# Patient Record
Sex: Female | Born: 1945 | Race: White | Hispanic: No | Marital: Married | Smoking: Never smoker
Health system: Southern US, Community
[De-identification: ages and names within clinical notes are randomized; demographics above are authoritative.]

## PROBLEM LIST (undated history)

## (undated) DIAGNOSIS — C4491 Basal cell carcinoma of skin, unspecified: Secondary | ICD-10-CM

## (undated) DIAGNOSIS — H9190 Unspecified hearing loss, unspecified ear: Secondary | ICD-10-CM

## (undated) DIAGNOSIS — J189 Pneumonia, unspecified organism: Secondary | ICD-10-CM

## (undated) DIAGNOSIS — F419 Anxiety disorder, unspecified: Secondary | ICD-10-CM

## (undated) DIAGNOSIS — F32A Depression, unspecified: Secondary | ICD-10-CM

## (undated) DIAGNOSIS — I509 Heart failure, unspecified: Secondary | ICD-10-CM

## (undated) DIAGNOSIS — F99 Mental disorder, not otherwise specified: Secondary | ICD-10-CM

## (undated) DIAGNOSIS — E7800 Pure hypercholesterolemia, unspecified: Secondary | ICD-10-CM

## (undated) DIAGNOSIS — T4145XA Adverse effect of unspecified anesthetic, initial encounter: Secondary | ICD-10-CM

## (undated) DIAGNOSIS — E039 Hypothyroidism, unspecified: Secondary | ICD-10-CM

## (undated) DIAGNOSIS — I10 Essential (primary) hypertension: Secondary | ICD-10-CM

## (undated) DIAGNOSIS — T8859XA Other complications of anesthesia, initial encounter: Secondary | ICD-10-CM

## (undated) DIAGNOSIS — M1990 Unspecified osteoarthritis, unspecified site: Secondary | ICD-10-CM

## (undated) DIAGNOSIS — C50919 Malignant neoplasm of unspecified site of unspecified female breast: Secondary | ICD-10-CM

## (undated) DIAGNOSIS — F329 Major depressive disorder, single episode, unspecified: Secondary | ICD-10-CM

## (undated) DIAGNOSIS — G250 Essential tremor: Secondary | ICD-10-CM

## (undated) DIAGNOSIS — IMO0001 Reserved for inherently not codable concepts without codable children: Secondary | ICD-10-CM

## (undated) HISTORY — PX: COLONOSCOPY W/ BIOPSIES AND POLYPECTOMY: SHX1376

## (undated) HISTORY — PX: JOINT REPLACEMENT: SHX530

## (undated) HISTORY — PX: VARICOSE VEIN SURGERY: SHX832

## (undated) HISTORY — PX: ABDOMINAL HYSTERECTOMY: SHX81

## (undated) HISTORY — PX: RECTAL PROLAPSE REPAIR: SHX759

---

## 2002-03-28 HISTORY — PX: CARDIAC CATHETERIZATION: SHX172

## 2009-03-28 HISTORY — PX: TOTAL HIP ARTHROPLASTY: SHX124

## 2009-10-02 ENCOUNTER — Inpatient Hospital Stay (HOSPITAL_COMMUNITY): Admission: RE | Admit: 2009-10-02 | Discharge: 2009-10-04 | Payer: Self-pay | Admitting: Orthopedic Surgery

## 2010-06-13 LAB — CBC
HCT: 25.8 % — ABNORMAL LOW (ref 36.0–46.0)
MCH: 32.3 pg (ref 26.0–34.0)
MCH: 32.4 pg (ref 26.0–34.0)
MCHC: 34.2 g/dL (ref 30.0–36.0)
MCHC: 34.5 g/dL (ref 30.0–36.0)
MCV: 93.4 fL (ref 78.0–100.0)
MCV: 94.1 fL (ref 78.0–100.0)
Platelets: 161 10*3/uL (ref 150–400)
Platelets: 210 10*3/uL (ref 150–400)
RDW: 12.8 % (ref 11.5–15.5)
RDW: 13.4 % (ref 11.5–15.5)
RDW: 13.8 % (ref 11.5–15.5)
WBC: 8.5 10*3/uL (ref 4.0–10.5)

## 2010-06-13 LAB — URINALYSIS, ROUTINE W REFLEX MICROSCOPIC
Bilirubin Urine: NEGATIVE
Glucose, UA: NEGATIVE mg/dL
Hgb urine dipstick: NEGATIVE
Specific Gravity, Urine: 1.004 — ABNORMAL LOW (ref 1.005–1.030)

## 2010-06-13 LAB — BASIC METABOLIC PANEL
BUN: 6 mg/dL (ref 6–23)
CO2: 27 mEq/L (ref 19–32)
Chloride: 106 mEq/L (ref 96–112)
GFR calc non Af Amer: >60 mL/min (ref >60)
Glucose, Bld: 103 mg/dL — ABNORMAL HIGH (ref 70–99)
Potassium: 3.7 mEq/L (ref 3.5–5.1)
Potassium: 4.5 mEq/L (ref 3.5–5.1)
Sodium: 135 mEq/L (ref 135–145)
Sodium: 139 mEq/L (ref 135–145)

## 2010-06-13 LAB — DIFFERENTIAL
Basophils Relative: 0 % (ref 0–1)
Eosinophils Relative: 1 % (ref 0–5)
Lymphocytes Relative: 31 % (ref 12–46)
Lymphs Abs: 2.1 10*3/uL (ref 0.7–4.0)
Neutro Abs: 4.1 10*3/uL (ref 1.7–7.7)
Neutrophils Relative %: 61 % (ref 43–77)

## 2010-06-13 LAB — ABO/RH: ABO/RH(D): O POS

## 2010-06-13 LAB — TYPE AND SCREEN: Antibody Screen: NEGATIVE

## 2010-06-13 LAB — PROTIME-INR
INR: 0.97 (ref 0.00–1.49)
INR: 1.2 (ref 0.00–1.49)
INR: 1.29 (ref 0.00–1.49)
Prothrombin Time: 15.1 seconds (ref 11.6–15.2)

## 2010-06-13 LAB — APTT: aPTT: 28 seconds (ref 24–37)

## 2010-06-13 LAB — SURGICAL PCR SCREEN: Staphylococcus aureus: NEGATIVE

## 2011-03-29 HISTORY — PX: MASTECTOMY: SHX3

## 2012-04-24 ENCOUNTER — Ambulatory Visit: Payer: Self-pay | Admitting: Lab

## 2013-05-07 ENCOUNTER — Other Ambulatory Visit: Payer: Self-pay | Admitting: Orthopedic Surgery

## 2013-05-14 ENCOUNTER — Inpatient Hospital Stay (HOSPITAL_COMMUNITY): Admission: RE | Admit: 2013-05-14 | Payer: Self-pay | Source: Ambulatory Visit

## 2013-05-14 ENCOUNTER — Other Ambulatory Visit (HOSPITAL_COMMUNITY): Payer: Self-pay

## 2013-05-14 ENCOUNTER — Encounter (HOSPITAL_COMMUNITY): Payer: Self-pay | Admitting: Pharmacy Technician

## 2013-05-15 ENCOUNTER — Other Ambulatory Visit (HOSPITAL_COMMUNITY): Payer: Medicare HMO

## 2013-05-16 ENCOUNTER — Encounter (HOSPITAL_COMMUNITY)
Admission: RE | Admit: 2013-05-16 | Discharge: 2013-05-16 | Disposition: A | Discharge status: Home / Self Care | Payer: Medicare HMO | Source: Ambulatory Visit | Attending: Orthopedic Surgery | Admitting: Orthopedic Surgery

## 2013-05-16 ENCOUNTER — Other Ambulatory Visit (HOSPITAL_COMMUNITY): Payer: Medicare HMO

## 2013-05-16 ENCOUNTER — Encounter (HOSPITAL_COMMUNITY): Payer: Self-pay

## 2013-05-16 DIAGNOSIS — Z01818 Encounter for other preprocedural examination: Secondary | ICD-10-CM | POA: Insufficient documentation

## 2013-05-16 DIAGNOSIS — Z01812 Encounter for preprocedural laboratory examination: Secondary | ICD-10-CM | POA: Insufficient documentation

## 2013-05-16 DIAGNOSIS — Z01810 Encounter for preprocedural cardiovascular examination: Secondary | ICD-10-CM | POA: Insufficient documentation

## 2013-05-16 HISTORY — DX: Hypothyroidism, unspecified: E03.9

## 2013-05-16 HISTORY — DX: Other complications of anesthesia, initial encounter: T88.59XA

## 2013-05-16 HISTORY — DX: Essential tremor: G25.0

## 2013-05-16 HISTORY — DX: Unspecified osteoarthritis, unspecified site: M19.90

## 2013-05-16 HISTORY — DX: Heart failure, unspecified: I50.9

## 2013-05-16 HISTORY — DX: Mental disorder, not otherwise specified: F99

## 2013-05-16 HISTORY — DX: Adverse effect of unspecified anesthetic, initial encounter: T41.45XA

## 2013-05-16 HISTORY — DX: Pure hypercholesterolemia, unspecified: E78.00

## 2013-05-16 HISTORY — DX: Anxiety disorder, unspecified: F41.9

## 2013-05-16 HISTORY — DX: Pneumonia, unspecified organism: J18.9

## 2013-05-16 HISTORY — DX: Unspecified hearing loss, unspecified ear: H91.90

## 2013-05-16 HISTORY — DX: Depression, unspecified: F32.A

## 2013-05-16 HISTORY — DX: Reserved for inherently not codable concepts without codable children: IMO0001

## 2013-05-16 HISTORY — DX: Major depressive disorder, single episode, unspecified: F32.9

## 2013-05-16 LAB — BASIC METABOLIC PANEL
BUN: 16 mg/dL (ref 6–23)
CHLORIDE: 104 meq/L (ref 96–112)
CO2: 26 mEq/L (ref 19–32)
Calcium: 9.9 mg/dL (ref 8.4–10.5)
Creatinine, Ser: 0.77 mg/dL (ref 0.50–1.10)
GFR calc Af Amer: >90 mL/min (ref >90)
GFR calc non Af Amer: 85 mL/min — ABNORMAL LOW (ref >90)
GLUCOSE: 103 mg/dL — AB (ref 70–99)
POTASSIUM: 4.9 meq/L (ref 3.7–5.3)
Sodium: 143 mEq/L (ref 137–147)

## 2013-05-16 LAB — CBC WITH DIFFERENTIAL/PLATELET
BASOS PCT: 0 % (ref 0–1)
Basophils Absolute: 0 10*3/uL (ref 0.0–0.1)
EOS ABS: 0.1 10*3/uL (ref 0.0–0.7)
Eosinophils Relative: 1 % (ref 0–5)
HCT: 43.6 % (ref 36.0–46.0)
HEMOGLOBIN: 15.1 g/dL — AB (ref 12.0–15.0)
LYMPHS ABS: 2.3 10*3/uL (ref 0.7–4.0)
Lymphocytes Relative: 29 % (ref 12–46)
MCH: 32.1 pg (ref 26.0–34.0)
MCHC: 34.6 g/dL (ref 30.0–36.0)
MCV: 92.8 fL (ref 78.0–100.0)
MONO ABS: 0.4 10*3/uL (ref 0.1–1.0)
MONOS PCT: 5 % (ref 3–12)
Neutro Abs: 5.2 10*3/uL (ref 1.7–7.7)
Neutrophils Relative %: 65 % (ref 43–77)
Platelets: 241 10*3/uL (ref 150–400)
RBC: 4.7 MIL/uL (ref 3.87–5.11)
RDW: 13.4 % (ref 11.5–15.5)
WBC: 8 10*3/uL (ref 4.0–10.5)

## 2013-05-16 LAB — URINALYSIS, ROUTINE W REFLEX MICROSCOPIC
Bilirubin Urine: NEGATIVE
GLUCOSE, UA: NEGATIVE mg/dL
Hgb urine dipstick: NEGATIVE
Ketones, ur: NEGATIVE mg/dL
LEUKOCYTES UA: NEGATIVE
NITRITE: NEGATIVE
PROTEIN: NEGATIVE mg/dL
Specific Gravity, Urine: 1.007 (ref 1.005–1.030)
Urobilinogen, UA: 0.2 mg/dL (ref 0.0–1.0)
pH: 7 (ref 5.0–8.0)

## 2013-05-16 LAB — TYPE AND SCREEN
ABO/RH(D): O POS
Antibody Screen: NEGATIVE

## 2013-05-16 LAB — PROTIME-INR
INR: 0.89 (ref 0.00–1.49)
Prothrombin Time: 11.9 seconds (ref 11.6–15.2)

## 2013-05-16 LAB — APTT: aPTT: 26 seconds (ref 24–37)

## 2013-05-16 LAB — SURGICAL PCR SCREEN
MRSA, PCR: NEGATIVE
Staphylococcus aureus: NEGATIVE

## 2013-05-16 NOTE — Progress Notes (Addendum)
Pt denies SOB, chest pain, and being under the care of a cardiologist. Pt stated that she had a stress test and cardiac cath in 2004 and her last echo in " 2005 or 2006 " at [facility]; pt denies being under the care of a cardiologist since 2005 or 2006 when " the echo was okay." Pt denies having an EKG and chest x ray within the last year.

## 2013-05-16 NOTE — Pre-Procedure Instructions (Signed)
[name]  05/16/2013   Your procedure is scheduled on:  Monday, May 20, 2013 at 9:15 AM  Report to [facility] (use Main Entrance "A'') at 7:15 AM.  Call this number if you have problems the morning of surgery: [phone]   Remember:   Do not eat food or drink liquids after midnight.   Take these medicines the morning of surgery with A SIP OF WATER: carvedilol (COREG) 25 MG tablet, DULoxetine (CYMBALTA) 30 MG capsule, levothyroxine (SYNTHROID, LEVOTHROID) 50 MCG  Stop taking Aspirin and herbal medications. Do not take any NSAIDs ie: Ibuprofen, Advil, Naproxen or any medication containing Aspirin ( meloxicam (MOBIC) 15 MG tablet)  Do not wear jewelry, make-up otr nail polish.  Do not wear lotions, powders, or perfumes. You may wear deodorant.  Do not shave 48 hours prior to surgery.   Do not bring valuables to the hospital.  [facility] is not responsible for any belongings or valuables.               Contacts, dentures or bridgework may not be worn into surgery.  Leave suitcase in the car. After surgery it may be brought to your room.  For patients admitted to the hospital, discharge time is determined by your treatment team.               Patients discharged the day of surgery will not be allowed to drive home.  Name and phone number of your driver:  Special Instructions:  Special Instructions:Special Instructions: [facility] - Preparing for Surgery  Before surgery, you can play an important role.  Because skin is not sterile, your skin needs to be as free of germs as possible.  You can reduce the number of germs on you skin by washing with CHG (chlorahexidine gluconate) soap before surgery.  CHG is an antiseptic cleaner which kills germs and bonds with the skin to continue killing germs even after washing.  Please DO NOT use if you have an allergy to CHG or antibacterial soaps.  If your skin becomes reddened/irritated stop using the CHG and inform your nurse  when you arrive at Short Stay.  Do not shave (including legs and underarms) for at least 48 hours prior to the first CHG shower.  You may shave your face.  Please follow these instructions carefully:   1.  Shower with CHG Soap the night before surgery and the morning of Surgery.  2.  If you choose to wash your hair, wash your hair first as usual with your normal shampoo.  3.  After you shampoo, rinse your hair and body thoroughly to remove the Shampoo.  4.  Use CHG as you would any other liquid soap.  You can apply chg directly  to the skin and wash gently with scrungie or a clean washcloth.  5.  Apply the CHG Soap to your body ONLY FROM THE NECK DOWN.  Do not use on open wounds or open sores.  Avoid contact with your eyes, ears, mouth and genitals (private parts).  Wash genitals (private parts) with your normal soap.  6.  Wash thoroughly, paying special attention to the area where your surgery will be performed.  7.  Thoroughly rinse your body with warm water from the neck down.  8.  DO NOT shower/wash with your normal soap after using and rinsing off the CHG Soap.  9.  Pat yourself dry with a clean towel.  10.  Wear clean pajamas.            11.  Place clean sheets on your bed the night of your first shower and do not sleep with pets.  Day of Surgery  Do not apply any lotions the morning of surgery.  Please wear clean clothes to the hospital/surgery center.   Please read over the following fact sheets that you were given: Pain Booklet, Coughing and Deep Breathing, Blood Transfusion Information, Total Joint Packet, MRSA Information and Surgical Site Infection Prevention

## 2013-05-17 NOTE — H&P (Signed)
TOTAL HIP ADMISSION H&P  Patient is admitted for left total hip arthroplasty.  Subjective:  Chief Complaint: left hip pain  HPI: Laura Zavala, 68 y.o. female, has a history of pain and functional disability in the left hip(s) due to arthritis and patient has failed non-surgical conservative treatments for greater than 12 weeks to include NSAID's and/or analgesics, corticosteriod injections, supervised PT with diminished ADL's post treatment and activity modification.  Onset of symptoms was gradual starting several years ago with gradually worsening course since that time.The patient noted no past surgery on the left hip(s).  Patient currently rates pain in the left hip at 10 out of 10 with activity. Patient has worsening of pain with activity and weight bearing, pain that interfers with activities of daily living and pain with passive range of motion. Patient has evidence of joint space narrowing by imaging studies. This condition presents safety issues increasing the risk of falls.  There is no current active infection.  There are no active problems to display for this patient.  Past Medical History  Diagnosis Date  . Complication of anesthesia     Hx: of SOB : when I was under the age of 5."  . OA (osteoarthritis)     Hx: of   . Hearing impaired     Hx: of B/L hearing aids  . CHF (congestive heart failure)   . Hypercholesterolemia     Hx: of  . Cancer     Basal cell carcinoma of the nose; breast cancer  . Pneumonia   . Hypothyroidism   . Mental disorder   . Anxiety   . Depression   . Tremor, hereditary, benign     Hx: of head    Past Surgical History  Procedure Laterality Date  . Mastectomy      Hx: of B/L  . Abdominal hysterectomy    . Varicose vein surgery      Hx; of  . Cardiac catheterization      Hx: of 2004  . Colonoscopy w/ biopsies and polypectomy      Hx: of  . Joint replacement      hx: of Right hip  . Rectal prolapse repair      Hx: of    No  prescriptions prior to admission   Allergies  Allergen Reactions  . Keflex [Cephalexin] Nausea And Vomiting and Other (See Comments)    headache  . Oxycodone Rash    History  Substance Use Topics  . Smoking status: Never Smoker   . Smokeless tobacco: Never Used  . Alcohol Use: Yes     Comment: 2 glasses of wine with dinner    Family History  Problem Relation Age of Onset  . Cancer Other      Review of Systems  Constitutional: Negative.   HENT: Negative.   Eyes: Negative.   Respiratory: Negative.   Cardiovascular: Negative.   Gastrointestinal: Negative.   Genitourinary: Negative.   Musculoskeletal: Positive for joint pain.       Left hip pain  Skin: Negative.   Neurological: Negative.   Psychiatric/Behavioral: Negative.     Objective:  Physical Exam  Constitutional: She is oriented to person, place, and time. She appears well-developed and well-nourished.  HENT:  Head: Normocephalic and atraumatic.  Eyes: Pupils are equal, round, and reactive to light.  Neck: Normal range of motion. Neck supple.  Cardiovascular: Intact distal pulses.   Respiratory: Effort normal.  Musculoskeletal:  Patient's left hip does have reduced range  with internal and external rotation.  She does have moderate pain with internal rotation.  No pain with palpation of the buttock or lateral hip.  She does have groin pain.  She has good strength bilaterally.  She has brisk capillary refill and is neurovascularly intact distally.  Neurological: She is alert and oriented to person, place, and time.  Skin: Skin is warm and dry.  Psychiatric: She has a normal mood and affect. Her behavior is normal. Judgment and thought content normal.    Vital signs in last 24 hours:    Labs:   There is no height or weight on file to calculate BMI.   Imaging Review Radiographs:  X-rays were ordered, performed, and interpreted by myself and Dr. Mayer Camel today included; AP of the pelvis and lateral views of  bilateral hips are taken and reviewed in office today.  No fracture dislocation subluxation or tumors identified.  Patient does have moderate left hip arthritis.  Assessment/Plan:  End stage arthritis, left hip(s)  The patient history, physical examination, clinical judgement of the provider and imaging studies are consistent with end stage degenerative joint disease of the left hip(s) and total hip arthroplasty is deemed medically necessary. The treatment options including medical management, injection therapy, arthroscopy and arthroplasty were discussed at length. The risks and benefits of total hip arthroplasty were presented and reviewed. The risks due to aseptic loosening, infection, stiffness, dislocation/subluxation,  thromboembolic complications and other imponderables were discussed.  The patient acknowledged the explanation, agreed to proceed with the plan and consent was signed. Patient is being admitted for inpatient treatment for surgery, pain control, PT, OT, prophylactic antibiotics, VTE prophylaxis, progressive ambulation and ADL's and discharge planning.The patient is planning to be discharged to skilled nursing facility.

## 2013-05-19 MED ORDER — CEFAZOLIN SODIUM-DEXTROSE 2-3 GM-% IV SOLR
2.0000 g | INTRAVENOUS | Status: AC
Start: 2013-05-20 — End: 2013-05-20
  Administered 2013-05-20: 2 g via INTRAVENOUS
  Filled 2013-05-19: qty 50

## 2013-05-20 ENCOUNTER — Inpatient Hospital Stay (HOSPITAL_COMMUNITY)
Admission: RE | Admit: 2013-05-20 | Discharge: 2013-05-22 | DRG: 470 | Disposition: A | Discharge status: Home Health | Payer: Medicare HMO | Source: Ambulatory Visit | Attending: Orthopedic Surgery | Admitting: Orthopedic Surgery

## 2013-05-20 ENCOUNTER — Encounter (HOSPITAL_COMMUNITY): Payer: Self-pay | Admitting: *Deleted

## 2013-05-20 ENCOUNTER — Encounter (HOSPITAL_COMMUNITY): Payer: Medicare HMO | Admitting: Critical Care Medicine

## 2013-05-20 ENCOUNTER — Inpatient Hospital Stay (HOSPITAL_COMMUNITY): Payer: Medicare HMO | Admitting: Critical Care Medicine

## 2013-05-20 ENCOUNTER — Inpatient Hospital Stay (HOSPITAL_COMMUNITY): Payer: Medicare HMO

## 2013-05-20 ENCOUNTER — Encounter (HOSPITAL_COMMUNITY)
Admission: RE | Disposition: A | Discharge status: Home Health | Payer: Self-pay | Source: Ambulatory Visit | Attending: Orthopedic Surgery

## 2013-05-20 DIAGNOSIS — I509 Heart failure, unspecified: Secondary | ICD-10-CM | POA: Diagnosis present

## 2013-05-20 DIAGNOSIS — I10 Essential (primary) hypertension: Secondary | ICD-10-CM | POA: Diagnosis present

## 2013-05-20 DIAGNOSIS — M1610 Unilateral primary osteoarthritis, unspecified hip: Principal | ICD-10-CM | POA: Diagnosis present

## 2013-05-20 DIAGNOSIS — E039 Hypothyroidism, unspecified: Secondary | ICD-10-CM | POA: Diagnosis present

## 2013-05-20 DIAGNOSIS — M1612 Unilateral primary osteoarthritis, left hip: Secondary | ICD-10-CM | POA: Diagnosis present

## 2013-05-20 DIAGNOSIS — E7800 Pure hypercholesterolemia, unspecified: Secondary | ICD-10-CM | POA: Diagnosis present

## 2013-05-20 DIAGNOSIS — M169 Osteoarthritis of hip, unspecified: Principal | ICD-10-CM | POA: Diagnosis present

## 2013-05-20 HISTORY — DX: Unspecified osteoarthritis, unspecified site: M19.90

## 2013-05-20 HISTORY — PX: TOTAL HIP ARTHROPLASTY: SHX124

## 2013-05-20 HISTORY — DX: Basal cell carcinoma of skin, unspecified: C44.91

## 2013-05-20 HISTORY — DX: Essential (primary) hypertension: I10

## 2013-05-20 HISTORY — DX: Malignant neoplasm of unspecified site of unspecified female breast: C50.919

## 2013-05-20 SURGERY — ARTHROPLASTY, HIP, TOTAL,POSTERIOR APPROACH
Anesthesia: General | Site: Hip | Laterality: Left

## 2013-05-20 MED ORDER — ATORVASTATIN CALCIUM 10 MG PO TABS
10.0000 mg | ORAL_TABLET | Freq: Every day | ORAL | Status: DC
  Administered 2013-05-20 – 2013-05-21 (×2): 10 mg via ORAL
  Filled 2013-05-20 (×3): qty 1

## 2013-05-20 MED ORDER — LIDOCAINE HCL (CARDIAC) 20 MG/ML IV SOLN
INTRAVENOUS | Status: AC
  Filled 2013-05-20: qty 5

## 2013-05-20 MED ORDER — LIDOCAINE HCL (CARDIAC) 20 MG/ML IV SOLN
INTRAVENOUS | Status: DC | PRN
  Administered 2013-05-20: 100 mg via INTRAVENOUS

## 2013-05-20 MED ORDER — ASPIRIN EC 325 MG PO TBEC
325.0000 mg | DELAYED_RELEASE_TABLET | Freq: Every day | ORAL | Status: DC
  Administered 2013-05-21 – 2013-05-22 (×2): 325 mg via ORAL
  Filled 2013-05-20 (×3): qty 1

## 2013-05-20 MED ORDER — PROPOFOL 10 MG/ML IV BOLUS
INTRAVENOUS | Status: AC
  Filled 2013-05-20: qty 20

## 2013-05-20 MED ORDER — HYDROCODONE-ACETAMINOPHEN 7.5-325 MG/15ML PO SOLN
ORAL | Status: AC
  Filled 2013-05-20: qty 15

## 2013-05-20 MED ORDER — OXYCODONE HCL 5 MG PO TABS: 5.0000 mg | ORAL_TABLET | Freq: Once | ORAL | Status: DC | PRN

## 2013-05-20 MED ORDER — MAGNESIUM CITRATE PO SOLN: 1.0000 | Freq: Once | ORAL | Status: AC | PRN

## 2013-05-20 MED ORDER — GLYCOPYRROLATE 0.2 MG/ML IJ SOLN
INTRAMUSCULAR | Status: AC
  Filled 2013-05-20: qty 2

## 2013-05-20 MED ORDER — DULOXETINE HCL 30 MG PO CPEP
30.0000 mg | ORAL_CAPSULE | Freq: Every day | ORAL | Status: DC
  Administered 2013-05-21 – 2013-05-22 (×2): 30 mg via ORAL
  Filled 2013-05-20 (×2): qty 1

## 2013-05-20 MED ORDER — KCL IN DEXTROSE-NACL 20-5-0.45 MEQ/L-%-% IV SOLN
INTRAVENOUS | Status: DC
  Administered 2013-05-20: 125 mL via INTRAVENOUS
  Filled 2013-05-20 (×8): qty 1000

## 2013-05-20 MED ORDER — HYDROMORPHONE HCL PF 1 MG/ML IJ SOLN
INTRAMUSCULAR | Status: AC
  Filled 2013-05-20: qty 1

## 2013-05-20 MED ORDER — ROCURONIUM BROMIDE 100 MG/10ML IV SOLN
INTRAVENOUS | Status: DC | PRN
  Administered 2013-05-20: 50 mg via INTRAVENOUS

## 2013-05-20 MED ORDER — BUPIVACAINE-EPINEPHRINE (PF) 0.5% -1:200000 IJ SOLN
INTRAMUSCULAR | Status: AC
  Filled 2013-05-20: qty 10

## 2013-05-20 MED ORDER — BISACODYL 5 MG PO TBEC: 5.0000 mg | DELAYED_RELEASE_TABLET | Freq: Every day | ORAL | Status: DC | PRN

## 2013-05-20 MED ORDER — ONDANSETRON HCL 4 MG PO TABS: 4.0000 mg | ORAL_TABLET | Freq: Four times a day (QID) | ORAL | Status: DC | PRN

## 2013-05-20 MED ORDER — DEXAMETHASONE SODIUM PHOSPHATE 4 MG/ML IJ SOLN
INTRAMUSCULAR | Status: DC | PRN
  Administered 2013-05-20: 4 mg via INTRAVENOUS

## 2013-05-20 MED ORDER — ONDANSETRON HCL 4 MG/2ML IJ SOLN: 4.0000 mg | Freq: Four times a day (QID) | INTRAMUSCULAR | Status: DC | PRN

## 2013-05-20 MED ORDER — DEXTROSE-NACL 5-0.45 % IV SOLN: INTRAVENOUS | Status: DC

## 2013-05-20 MED ORDER — GLYCOPYRROLATE 0.2 MG/ML IJ SOLN
INTRAMUSCULAR | Status: DC | PRN
  Administered 2013-05-20: 0.6 mg via INTRAVENOUS

## 2013-05-20 MED ORDER — NEOSTIGMINE METHYLSULFATE 1 MG/ML IJ SOLN
INTRAMUSCULAR | Status: DC | PRN
  Administered 2013-05-20: 4 mg via INTRAVENOUS

## 2013-05-20 MED ORDER — HYDROCODONE-ACETAMINOPHEN 7.5-325 MG PO TABS
1.0000 | ORAL_TABLET | ORAL | Status: DC | PRN
  Administered 2013-05-20: 1 via ORAL
  Administered 2013-05-21 – 2013-05-22 (×3): 2 via ORAL
  Filled 2013-05-20 (×4): qty 2

## 2013-05-20 MED ORDER — MENTHOL 3 MG MT LOZG: 1.0000 | LOZENGE | OROMUCOSAL | Status: DC | PRN

## 2013-05-20 MED ORDER — METHOCARBAMOL 500 MG PO TABS
500.0000 mg | ORAL_TABLET | Freq: Four times a day (QID) | ORAL | Status: DC | PRN
  Administered 2013-05-20 – 2013-05-22 (×4): 500 mg via ORAL
  Filled 2013-05-20 (×4): qty 1

## 2013-05-20 MED ORDER — METHOCARBAMOL 100 MG/ML IJ SOLN
500.0000 mg | Freq: Four times a day (QID) | INTRAVENOUS | Status: DC | PRN
  Filled 2013-05-20: qty 5

## 2013-05-20 MED ORDER — NEOSTIGMINE METHYLSULFATE 1 MG/ML IJ SOLN
INTRAMUSCULAR | Status: AC
  Filled 2013-05-20: qty 10

## 2013-05-20 MED ORDER — HYDROMORPHONE HCL PF 1 MG/ML IJ SOLN
0.2500 mg | INTRAMUSCULAR | Status: DC | PRN
  Administered 2013-05-20 (×6): 0.5 mg via INTRAVENOUS

## 2013-05-20 MED ORDER — CHLORHEXIDINE GLUCONATE 4 % EX LIQD: 1.0000 "application " | Freq: Once | CUTANEOUS | Status: DC

## 2013-05-20 MED ORDER — TRANEXAMIC ACID 100 MG/ML IV SOLN
1000.0000 mg | INTRAVENOUS | Status: AC
  Administered 2013-05-20: 1000 mg via INTRAVENOUS
  Filled 2013-05-20: qty 10

## 2013-05-20 MED ORDER — MEPERIDINE HCL 25 MG/ML IJ SOLN: 6.2500 mg | INTRAMUSCULAR | Status: DC | PRN

## 2013-05-20 MED ORDER — SODIUM CHLORIDE 0.9 % IJ SOLN
INTRAMUSCULAR | Status: AC
  Filled 2013-05-20: qty 10

## 2013-05-20 MED ORDER — EPHEDRINE SULFATE 50 MG/ML IJ SOLN
INTRAMUSCULAR | Status: DC | PRN
  Administered 2013-05-20: 10 mg via INTRAVENOUS
  Administered 2013-05-20: 5 mg via INTRAVENOUS
  Administered 2013-05-20: 10 mg via INTRAVENOUS

## 2013-05-20 MED ORDER — METOCLOPRAMIDE HCL 5 MG/ML IJ SOLN: 5.0000 mg | Freq: Three times a day (TID) | INTRAMUSCULAR | Status: DC | PRN

## 2013-05-20 MED ORDER — CHLORHEXIDINE GLUCONATE 4 % EX LIQD: 60.0000 mL | Freq: Once | CUTANEOUS | Status: DC

## 2013-05-20 MED ORDER — ONDANSETRON HCL 4 MG/2ML IJ SOLN: 4.0000 mg | Freq: Once | INTRAMUSCULAR | Status: DC | PRN

## 2013-05-20 MED ORDER — ACETAMINOPHEN 325 MG PO TABS: 650.0000 mg | ORAL_TABLET | Freq: Four times a day (QID) | ORAL | Status: DC | PRN

## 2013-05-20 MED ORDER — MIDAZOLAM HCL 2 MG/2ML IJ SOLN
INTRAMUSCULAR | Status: AC
  Filled 2013-05-20: qty 2

## 2013-05-20 MED ORDER — METOCLOPRAMIDE HCL 10 MG PO TABS: 5.0000 mg | ORAL_TABLET | Freq: Three times a day (TID) | ORAL | Status: DC | PRN

## 2013-05-20 MED ORDER — ONDANSETRON HCL 4 MG/2ML IJ SOLN
INTRAMUSCULAR | Status: DC | PRN
  Administered 2013-05-20: 4 mg via INTRAVENOUS

## 2013-05-20 MED ORDER — PROPOFOL 10 MG/ML IV BOLUS
INTRAVENOUS | Status: DC | PRN
  Administered 2013-05-20: 100 mg via INTRAVENOUS

## 2013-05-20 MED ORDER — CARVEDILOL 25 MG PO TABS
25.0000 mg | ORAL_TABLET | Freq: Two times a day (BID) | ORAL | Status: DC
  Administered 2013-05-21 – 2013-05-22 (×3): 25 mg via ORAL
  Filled 2013-05-20 (×5): qty 1

## 2013-05-20 MED ORDER — SODIUM CHLORIDE 0.9 % IR SOLN
Status: DC | PRN
  Administered 2013-05-20: 1000 mL

## 2013-05-20 MED ORDER — ROCURONIUM BROMIDE 50 MG/5ML IV SOLN
INTRAVENOUS | Status: AC
  Filled 2013-05-20: qty 1

## 2013-05-20 MED ORDER — FENTANYL CITRATE 0.05 MG/ML IJ SOLN
INTRAMUSCULAR | Status: DC | PRN
  Administered 2013-05-20: 50 ug via INTRAVENOUS
  Administered 2013-05-20: 25 ug via INTRAVENOUS
  Administered 2013-05-20: 100 ug via INTRAVENOUS
  Administered 2013-05-20: 25 ug via INTRAVENOUS
  Administered 2013-05-20: 50 ug via INTRAVENOUS

## 2013-05-20 MED ORDER — EPHEDRINE SULFATE 50 MG/ML IJ SOLN
INTRAMUSCULAR | Status: AC
  Filled 2013-05-20: qty 1

## 2013-05-20 MED ORDER — ONDANSETRON HCL 4 MG/2ML IJ SOLN
INTRAMUSCULAR | Status: AC
  Filled 2013-05-20: qty 2

## 2013-05-20 MED ORDER — FENTANYL CITRATE 0.05 MG/ML IJ SOLN
INTRAMUSCULAR | Status: AC
  Filled 2013-05-20: qty 5

## 2013-05-20 MED ORDER — PHENYLEPHRINE HCL 10 MG/ML IJ SOLN
INTRAMUSCULAR | Status: DC | PRN
  Administered 2013-05-20 (×3): 80 ug via INTRAVENOUS

## 2013-05-20 MED ORDER — ARTIFICIAL TEARS OP OINT
TOPICAL_OINTMENT | OPHTHALMIC | Status: AC
  Filled 2013-05-20: qty 3.5

## 2013-05-20 MED ORDER — HYDROMORPHONE HCL PF 1 MG/ML IJ SOLN
1.0000 mg | INTRAMUSCULAR | Status: DC | PRN
Start: 2013-05-20 — End: 2013-05-22
  Administered 2013-05-20 – 2013-05-21 (×8): 1 mg via INTRAVENOUS
  Filled 2013-05-20 (×6): qty 1

## 2013-05-20 MED ORDER — METHOCARBAMOL 500 MG PO TABS
ORAL_TABLET | ORAL | Status: AC
  Filled 2013-05-20: qty 1

## 2013-05-20 MED ORDER — KCL IN DEXTROSE-NACL 20-5-0.45 MEQ/L-%-% IV SOLN
INTRAVENOUS | Status: AC
  Filled 2013-05-20: qty 1000

## 2013-05-20 MED ORDER — DIPHENHYDRAMINE HCL 12.5 MG/5ML PO ELIX
12.5000 mg | ORAL_SOLUTION | ORAL | Status: DC | PRN
Start: 2013-05-20 — End: 2013-05-22
  Administered 2013-05-20 – 2013-05-21 (×2): 25 mg via ORAL
  Filled 2013-05-20 (×2): qty 10

## 2013-05-20 MED ORDER — OXYCODONE HCL 5 MG/5ML PO SOLN: 5.0000 mg | Freq: Once | ORAL | Status: DC | PRN

## 2013-05-20 MED ORDER — PHENOL 1.4 % MT LIQD: 1.0000 | OROMUCOSAL | Status: DC | PRN

## 2013-05-20 MED ORDER — PHENYLEPHRINE 40 MCG/ML (10ML) SYRINGE FOR IV PUSH (FOR BLOOD PRESSURE SUPPORT)
PREFILLED_SYRINGE | INTRAVENOUS | Status: AC
  Filled 2013-05-20: qty 10

## 2013-05-20 MED ORDER — GLYCOPYRROLATE 0.2 MG/ML IJ SOLN
INTRAMUSCULAR | Status: AC
  Filled 2013-05-20: qty 1

## 2013-05-20 MED ORDER — LACTATED RINGERS IV SOLN
INTRAVENOUS | Status: DC
  Administered 2013-05-20 (×2): via INTRAVENOUS

## 2013-05-20 MED ORDER — BUPIVACAINE-EPINEPHRINE 0.5% -1:200000 IJ SOLN
INTRAMUSCULAR | Status: DC | PRN
  Administered 2013-05-20: 20 mL

## 2013-05-20 MED ORDER — MIDAZOLAM HCL 5 MG/5ML IJ SOLN
INTRAMUSCULAR | Status: DC | PRN
  Administered 2013-05-20: 2 mg via INTRAVENOUS

## 2013-05-20 MED ORDER — ACETAMINOPHEN 650 MG RE SUPP: 650.0000 mg | Freq: Four times a day (QID) | RECTAL | Status: DC | PRN

## 2013-05-20 MED ORDER — LEVOTHYROXINE SODIUM 50 MCG PO TABS
50.0000 ug | ORAL_TABLET | Freq: Every day | ORAL | Status: DC
  Administered 2013-05-21 – 2013-05-22 (×3): 50 ug via ORAL
  Filled 2013-05-20 (×3): qty 1

## 2013-05-20 MED ORDER — DOCUSATE SODIUM 100 MG PO CAPS
100.0000 mg | ORAL_CAPSULE | Freq: Two times a day (BID) | ORAL | Status: DC
  Administered 2013-05-20 – 2013-05-22 (×4): 100 mg via ORAL
  Filled 2013-05-20 (×7): qty 1

## 2013-05-20 MED ORDER — HYDROMORPHONE HCL PF 1 MG/ML IJ SOLN
INTRAMUSCULAR | Status: AC
  Administered 2013-05-20: 0.5 mg via INTRAVENOUS
  Filled 2013-05-20: qty 1

## 2013-05-20 MED ORDER — SENNOSIDES-DOCUSATE SODIUM 8.6-50 MG PO TABS: 1.0000 | ORAL_TABLET | Freq: Every evening | ORAL | Status: DC | PRN

## 2013-05-20 SURGICAL SUPPLY — 51 items
BLADE SAW SGTL 18X1.27X75 (BLADE) ×2 IMPLANT
BRUSH FEMORAL CANAL (MISCELLANEOUS) IMPLANT
CAPT HIP PF COP ×2 IMPLANT
CLOTH BEACON ORANGE TIMEOUT ST (SAFETY) ×2 IMPLANT
COVER BACK TABLE 24X17X13 BIG (DRAPES) IMPLANT
COVER SURGICAL LIGHT HANDLE (MISCELLANEOUS) ×4 IMPLANT
DRAPE ORTHO SPLIT 77X108 STRL (DRAPES) ×1
DRAPE PROXIMA HALF (DRAPES) ×2 IMPLANT
DRAPE SURG ORHT 6 SPLT 77X108 (DRAPES) ×1 IMPLANT
DRAPE U-SHAPE 47X51 STRL (DRAPES) ×2 IMPLANT
DRILL BIT 7/64X5 (BIT) ×2 IMPLANT
DRSG AQUACEL AG ADV 3.5X10 (GAUZE/BANDAGES/DRESSINGS) ×2 IMPLANT
DURAPREP 26ML APPLICATOR (WOUND CARE) ×2 IMPLANT
ELECT BLADE 4.0 EZ CLEAN MEGAD (MISCELLANEOUS)
ELECT REM PT RETURN 9FT ADLT (ELECTROSURGICAL) ×2
ELECTRODE BLDE 4.0 EZ CLN MEGD (MISCELLANEOUS) IMPLANT
ELECTRODE REM PT RTRN 9FT ADLT (ELECTROSURGICAL) ×1 IMPLANT
GAUZE XEROFORM 1X8 LF (GAUZE/BANDAGES/DRESSINGS) ×2 IMPLANT
GLOVE BIO SURGEON STRL SZ7.5 (GLOVE) ×2 IMPLANT
GLOVE BIO SURGEON STRL SZ8.5 (GLOVE) ×4 IMPLANT
GLOVE BIOGEL PI IND STRL 8 (GLOVE) ×2 IMPLANT
GLOVE BIOGEL PI IND STRL 9 (GLOVE) ×1 IMPLANT
GLOVE BIOGEL PI INDICATOR 8 (GLOVE) ×2
GLOVE BIOGEL PI INDICATOR 9 (GLOVE) ×1
GOWN PREVENTION PLUS XLARGE (GOWN DISPOSABLE) ×2 IMPLANT
GOWN STRL NON-REIN LRG LVL3 (GOWN DISPOSABLE) ×4 IMPLANT
GOWN STRL REIN XL XLG (GOWN DISPOSABLE) ×4 IMPLANT
HANDPIECE INTERPULSE COAX TIP (DISPOSABLE)
HOOD PEEL AWAY FACE SHEILD DIS (HOOD) ×4 IMPLANT
KIT BASIN OR (CUSTOM PROCEDURE TRAY) ×2 IMPLANT
KIT ROOM TURNOVER OR (KITS) ×2 IMPLANT
MANIFOLD NEPTUNE II (INSTRUMENTS) ×2 IMPLANT
NEEDLE 22X1 1/2 (OR ONLY) (NEEDLE) ×2 IMPLANT
NS IRRIG 1000ML POUR BTL (IV SOLUTION) ×2 IMPLANT
PACK TOTAL JOINT (CUSTOM PROCEDURE TRAY) ×2 IMPLANT
PAD ARMBOARD 7.5X6 YLW CONV (MISCELLANEOUS) ×4 IMPLANT
PASSER SUT SWANSON 36MM LOOP (INSTRUMENTS) ×2 IMPLANT
PRESSURIZER FEMORAL UNIV (MISCELLANEOUS) IMPLANT
SET HNDPC FAN SPRY TIP SCT (DISPOSABLE) IMPLANT
SUT ETHIBOND 2 V 37 (SUTURE) ×2 IMPLANT
SUT ETHILON 3 0 FSL (SUTURE) ×2 IMPLANT
SUT VIC AB 0 CTB1 27 (SUTURE) ×2 IMPLANT
SUT VIC AB 1 CTX 36 (SUTURE) ×1
SUT VIC AB 1 CTX36XBRD ANBCTR (SUTURE) ×1 IMPLANT
SUT VIC AB 2-0 CTB1 (SUTURE) ×2 IMPLANT
SYR CONTROL 10ML LL (SYRINGE) ×2 IMPLANT
TOWEL OR 17X24 6PK STRL BLUE (TOWEL DISPOSABLE) ×2 IMPLANT
TOWEL OR 17X26 10 PK STRL BLUE (TOWEL DISPOSABLE) ×2 IMPLANT
TOWER CARTRIDGE SMART MIX (DISPOSABLE) IMPLANT
TRAY FOLEY CATH 14FR (SET/KITS/TRAYS/PACK) IMPLANT
WATER STERILE IRR 1000ML POUR (IV SOLUTION) ×2 IMPLANT

## 2013-05-20 NOTE — Preoperative (Signed)
Beta Blockers   Reason not to administer Beta Blockers:Not Applicable, pt took coreg 05/20/13

## 2013-05-20 NOTE — Progress Notes (Signed)
Utilization review completed.  

## 2013-05-20 NOTE — Anesthesia Postprocedure Evaluation (Signed)
Anesthesia Post Note  Patient: Laura Zavala  Procedure(s) Performed: Procedure(s) (LRB): LEFT TOTAL HIP ARTHROPLASTY (Left)  Anesthesia type: general  Patient location: PACU  Post pain: Pain level controlled  Post assessment: Patient's Cardiovascular Status Stable  Last Vitals:  Filed Vitals:   05/20/13 1200  BP:   Pulse: 71  Temp:   Resp: 16    Post vital signs: Reviewed and stable  Level of consciousness: sedated  Complications: No apparent anesthesia complications

## 2013-05-20 NOTE — Plan of Care (Signed)
Problem: Consults Goal: Diagnosis- Total Joint Replacement Primary Total Hip Left     

## 2013-05-20 NOTE — Transfer of Care (Signed)
Immediate Anesthesia Transfer of Care Note  Patient: Laura Zavala  Procedure(s) Performed: Procedure(s): LEFT TOTAL HIP ARTHROPLASTY (Left)  Patient Location: PACU  Anesthesia Type:General  Level of Consciousness: awake, alert  and oriented  Airway & Oxygen Therapy: Patient Spontanous Breathing and Patient connected to nasal cannula oxygen  Post-op Assessment: Report given to PACU RN, Post -op Vital signs reviewed and stable and Patient moving all extremities X 4  Post vital signs: Reviewed and stable  Complications: No apparent anesthesia complications

## 2013-05-20 NOTE — Anesthesia Preprocedure Evaluation (Addendum)
Anesthesia Evaluation  Patient identified by MRN, date of birth, ID band Patient awake    Reviewed: Allergy & Precautions, H&P , NPO status , Patient's Chart, lab work & pertinent test results, reviewed documented beta blocker date and time   Airway Mallampati: I TM Distance: >3 FB Neck ROM: Full    Dental  (+) Dental Advisory Given   Pulmonary          Cardiovascular +CHF     Neuro/Psych PSYCHIATRIC DISORDERS Anxiety Depression    GI/Hepatic   Endo/Other  Hypothyroidism   Renal/GU      Musculoskeletal  (+) Arthritis -,   Abdominal   Peds  Hematology   Anesthesia Other Findings   Reproductive/Obstetrics                          Anesthesia Physical Anesthesia Plan  ASA: II  Anesthesia Plan: General   Post-op Pain Management:    Induction: Intravenous  Airway Management Planned: Oral ETT  Additional Equipment:   Intra-op Plan:   Post-operative Plan: Extubation in OR  Informed Consent: I have reviewed the patients History and Physical, chart, labs and discussed the procedure including the risks, benefits and alternatives for the proposed anesthesia with the patient or authorized representative who has indicated his/her understanding and acceptance.   Dental advisory given  Plan Discussed with: Surgeon and CRNA  Anesthesia Plan Comments:        Anesthesia Quick Evaluation

## 2013-05-20 NOTE — Interval H&P Note (Signed)
History and Physical Interval Note:  05/20/2013 8:58 AM  Laura Zavala  has presented today for surgery, with the diagnosis of LEFT HIP OSTEOARTHRITIS   The various methods of treatment have been discussed with the patient and family. After consideration of risks, benefits and other options for treatment, the patient has consented to  Procedure(s): LEFT TOTAL HIP ARTHROPLASTY (Left) as a surgical intervention .  The patient's history has been reviewed, patient examined, no change in status, stable for surgery.  I have reviewed the patient's chart and labs.  Questions were answered to the patient's satisfaction.     Kerin Salen

## 2013-05-20 NOTE — Op Note (Addendum)
OPERATIVE REPORT    DATE OF PROCEDURE:  05/20/2013       PREOPERATIVE DIAGNOSIS:  LEFT HIP OSTEOARTHRITIS                                                           POSTOPERATIVE DIAGNOSIS:  LEFT HIP OSTEOARTHRITIS                                                            PROCEDURE:  L total hip arthroplasty using a 52 mm DePuy Pinnacle  Cup, [employer], 10-degree polyethylene liner index superior  and posterior, a +0 36 mm ceramic head, a 16x11x150x36 SROM stem, 16DL Sleeve   SURGEON: Brezlyn Manrique J    ASSISTANT:   [employer]  (present throughout entire procedure and necessary for timely completion of the procedure)   ANESTHESIA: General BLOOD LOSS: 300 FLUID REPLACEMENT: 1500 crystalloid DRAINS: Foley Catheter URINE OUTPUT: COMPLICATIONS: none    INDICATIONS FOR PROCEDURE: A 68 y.o. year-old With  LEFT HIP OSTEOARTHRITIS    for 3 years, x-rays show bone-on-bone arthritic changes. Despite conservative measures with observation, anti-inflammatory medicine, narcotics, use of a cane, has severe unremitting pain and can ambulate only a few blocks before resting.  Patient desires elective L total hip arthroplasty to decrease pain and increase function. The risks, benefits, and alternatives were discussed at length including but not limited to the risks of infection, bleeding, nerve injury, stiffness, blood clots, the need for revision surgery, cardiopulmonary complications, among others, and they were willing to proceed. Questions answered     PROCEDURE IN DETAIL: The patient was identified by armband,  received preoperative IV antibiotics in the holding area at [facility], taken to the operating room , appropriate anesthetic monitors  were attached and general endotracheal anesthesia induced. Foley catheter was inserted. Pt was rolled into the R lateral decubitus position and fixed there with a Stulberg Mark II pelvic clamp.  The L lower extremity was then  prepped and draped  in the usual sterile fashion from the ankle to the hemipelvis. A time-out  procedure was performed. The skin along the lateral hip and thigh  infiltrated with 10 mL of 0.5% Marcaine and epinephrine solution. We  then made a posterolateral approach to the hip. With a #10 blade, a 16 cm  incision was made through the skin and subcutaneous tissue down to the level of the  IT band. Small bleeders were identified and cauterized. The IT band was cut in  line with skin incision exposing the greater trochanter. A Cobra retractor was placed between the gluteus minimus and the superior hip joint capsule, and a spiked Cobra between the quadratus femoris and the inferior hip joint capsule. This isolated the short  external rotators and piriformis tendons. These were tagged with a #2 Ethibond  suture and cut off their insertion on the intertrochanteric crest. The posterior  capsule was then developed into an acetabular-based flap from Posterior Superior off of the acetabulum out over the femoral neck and back posterior inferior to the acetabular rim. This flap was tagged with two #  2 Ethibond sutures and retracted protecting the sciatic nerve. This exposed the arthritic femoral head and osteophytes. The hip was then flexed and internally rotated, dislocating the femoral head and a standard neck cut performed 1 fingerbreadth above the lesser trochanter.  A spiked Cobra was placed in the cotyloid notch and a Hohmann retractor was then used to lever the femur anteriorly off of the anterior pelvic column. A posterior-inferior wing retractor was placed at the junction of the acetabulum and the ischium completing the acetabular exposure.We then removed the peripheral osteophytes and labrum from the acetabulum. We then reamed the acetabulum up to 51 mm with basket reamers obtaining good coverage in all quadrants. We then irrigated with normal  saline solution and hammered into place a 52 mm pinnacle cup  in 45  degrees of abduction and about 20 degrees of anteversion. More  peripheral osteophytes removed and a trial 10-degree liner placed with the  index superior-posterior. The hip was then flexed and internally rotated exposing the  proximal femur, which was entered with the initiating reamer followed by  the axial reamers up to a 11.5 mm full depth and 12mm partial depth. We then conically reamed to 16D to the correct depth for a 36 base neck. The calcar was milled to 16DL. A trial cone and stem was inserted in the 25 degrees anteversion, with a +0 46mm trial head. Trial reduction was then performed and excellent stability was noted with at 90 of flexion with 75 of internal rotation and then full extension with maximal external rotation. The hip could not be dislocated in full extension. The knee could easily flex  to about 130 degrees. We also stretched the abductors at this point,  because of the preexisting adductor contractures. All trial components  were then removed. The acetabulum was irrigated out with normal saline  solution. A titanium Apex [facility] was then screwed into place  followed by a 10-degree polyethylene liner index superior-posterior. On  the femoral side a 16DL ZTT1 sleeve was hammered into place, followed by a 16x11x35x150 SROM stem in 25 degrees of anteversion. At this point, a +0 36 mm ceramic head was  hammered on the stem. The hip was reduced. We checked our stability  one more time and found it to be excellent. The wound was once again  thoroughly irrigated out with normal saline solution pulse lavage. The  capsular flap and short external rotators were repaired back to the  intertrochanteric crest through drill holes with a #2 Ethibond suture.  The IT band was closed with running 1 Vicryl suture. The subcutaneous  tissue with 0 and 2-0 undyed Vicryl suture and the skin with running  interlocking 3-0 nylon suture. Dressing of Xeroform and Mepilex was  then  applied. The patient was then unclamped, rolled supine, awaken extubated and taken to recovery room without difficulty in stable condition.   Elaysia Devargas J 05/20/2013, 10:14 AM

## 2013-05-20 NOTE — Anesthesia Procedure Notes (Signed)
Procedure Name: Intubation Date/Time: 05/20/2013 9:14 AM Performed by: Carola Frost Pre-anesthesia Checklist: Patient identified, Timeout performed, Emergency Drugs available, Suction available and Patient being monitored Patient Re-evaluated:Patient Re-evaluated prior to inductionOxygen Delivery Method: Circle system utilized Preoxygenation: Pre-oxygenation with 100% oxygen Intubation Type: IV induction Ventilation: Mask ventilation without difficulty Laryngoscope Size: Mac and 3 Grade View: Grade I Tube type: Oral Tube size: 7.0 mm Number of attempts: 2 Airway Equipment and Method: Stylet Placement Confirmation: positive ETCO2 and ETT inserted through vocal cords under direct vision Secured at: 21 cm Tube secured with: Tape Dental Injury: Teeth and Oropharynx as per pre-operative assessment  Comments: DLx1 by paramedic student. Unable to obtain view.  DLx1 by CRNA. Grade 1 view, atraumatic insertion of 7.0 ETT.

## 2013-05-21 ENCOUNTER — Encounter (HOSPITAL_COMMUNITY): Payer: Self-pay | Admitting: Orthopedic Surgery

## 2013-05-21 LAB — CBC
HCT: 32.4 % — ABNORMAL LOW (ref 36.0–46.0)
Hemoglobin: 11.3 g/dL — ABNORMAL LOW (ref 12.0–15.0)
MCH: 32.3 pg (ref 26.0–34.0)
MCHC: 34.9 g/dL (ref 30.0–36.0)
MCV: 92.6 fL (ref 78.0–100.0)
PLATELETS: 186 10*3/uL (ref 150–400)
RBC: 3.5 MIL/uL — AB (ref 3.87–5.11)
RDW: 13.5 % (ref 11.5–15.5)
WBC: 10.3 10*3/uL (ref 4.0–10.5)

## 2013-05-21 LAB — BASIC METABOLIC PANEL
BUN: 10 mg/dL (ref 6–23)
CHLORIDE: 103 meq/L (ref 96–112)
CO2: 27 mEq/L (ref 19–32)
Calcium: 8.6 mg/dL (ref 8.4–10.5)
Creatinine, Ser: 0.77 mg/dL (ref 0.50–1.10)
GFR calc Af Amer: >90 mL/min (ref >90)
GFR calc non Af Amer: 85 mL/min — ABNORMAL LOW (ref >90)
Glucose, Bld: 133 mg/dL — ABNORMAL HIGH (ref 70–99)
Potassium: 4.5 mEq/L (ref 3.7–5.3)
SODIUM: 140 meq/L (ref 137–147)

## 2013-05-21 MED ORDER — HYDROMORPHONE HCL 2 MG PO TABS: 2.0000 mg | ORAL_TABLET | ORAL | Status: AC | PRN

## 2013-05-21 NOTE — Evaluation (Signed)
Physical Therapy Evaluation Patient Details Name: Laura Zavala MRN: [id number] DOB: 1946/01/04 Today's Date: 05/21/2013 Time: 8546-2703 PT Time Calculation (min): 23 min  PT Assessment / Plan / Recommendation History of Present Illness  LEFT TOTAL HIP ARTHROPLASTY (Left  Clinical Impression  Pt is s/p THA resulting in the deficits listed below (see PT Problem List).  Pt will benefit from skilled PT to increase their independence and safety with mobility to allow discharge to the venue listed below.        PT Assessment  Patient needs continued PT services    Follow Up Recommendations  Home health PT;Supervision/Assistance - 24 hour    Does the patient have the potential to tolerate intense rehabilitation      Barriers to Discharge        Equipment Recommendations  None recommended by PT    Recommendations for Other Services OT consult   Frequency 7X/week    Precautions / Restrictions Precautions Precautions: Posterior Hip Precaution Booklet Issued: Yes (comment) Precaution Comments: Reviewed post prec (pt has had other hip replaced in past Restrictions LLE Weight Bearing: Weight bearing as tolerated   Pertinent Vitals/Pain 8/10 L hip pain post amb; patient repositioned for comfort RN provided medication to assist with pain control       Mobility  Bed Mobility Overal bed mobility: Needs Assistance Bed Mobility: Sit to Supine Sit to supine: Min assist General bed mobility comments: Min assist for helping LLE into bed; cues for technique; bed elevated to approximate home Transfers Overall transfer level: Needs assistance Equipment used: Rolling walker (2 wheeled) Transfers: Sit to/from Stand Sit to Stand: Min guard General transfer comment: Cues for hand placement Ambulation/Gait Ambulation/Gait assistance: Min guard Ambulation Distance (Feet): 65 Feet Assistive device: Rolling walker (2 wheeled) Gait Pattern/deviations: Step-to pattern Gait velocity:  slowed General Gait Details: Cues for post hip prec, especially during turns    Exercises     PT Diagnosis: Difficulty walking;Acute pain  PT Problem List: Decreased strength;Decreased range of motion;Decreased activity tolerance;Decreased mobility;Decreased knowledge of use of DME;Decreased knowledge of precautions;Pain PT Treatment Interventions: DME instruction;Gait training;Stair training;Functional mobility training;Therapeutic activities;Therapeutic exercise;Patient/family education     PT Goals(Current goals can be found in the care plan section) Acute Rehab PT Goals Patient Stated Goal: back to dancing PT Goal Formulation: With patient Time For Goal Achievement: 05/28/13 Potential to Achieve Goals: Good  Visit Information  Last PT Received On: 05/21/13 Assistance Needed: +1 History of Present Illness: LEFT TOTAL HIP ARTHROPLASTY (Left       Prior [place] expects to be discharged to:: Private residence Living Arrangements: Spouse/significant other Available Help at Discharge: Family;Available 24 hours/day Type of Home: House Home Access: Stairs to enter [employer] of Steps: 1 Entrance Stairs-Rails: None Home Layout: One level Home Equipment: Walker - 2 wheels;Bedside commode;Shower seat Prior Function Level of Independence: Independent Communication Communication: No difficulties    Cognition  Cognition Arousal/Alertness: Awake/alert Behavior During Therapy: WFL for tasks assessed/performed Overall Cognitive Status: Within Functional Limits for tasks assessed    Extremity/Trunk Assessment Upper Extremity Assessment Upper Extremity Assessment: Overall WFL for tasks assessed Lower Extremity Assessment Lower Extremity Assessment: LLE deficits/detail LLE Deficits / Details: Grossly decr AROM and strength, limited by pain postop   Balance    End of Session PT - End of Session Activity Tolerance: Patient tolerated  treatment well Patient left: in bed;with call bell/phone within reach Nurse Communication: Mobility status  GP     Laura Zavala  [place], [place]  05/21/2013, 1:27 PM

## 2013-05-21 NOTE — Progress Notes (Signed)
Physical Therapy Treatment Patient Details Name: Laura Zavala MRN: [id number] DOB: Oct 22, 1945 Today's Date: 05/21/2013 Time: 1191-4782 PT Time Calculation (min): 19 min  PT Assessment / Plan / Recommendation  History of Present Illness LEFT TOTAL HIP ARTHROPLASTY (Left   PT Comments   Pt in significant pain, but still willing to work; On track for dc home  Follow Up Recommendations  Home health PT;Supervision/Assistance - 24 hour     Does the patient have the potential to tolerate intense rehabilitation     Barriers to Discharge        Equipment Recommendations  None recommended by PT    Recommendations for Other Services OT consult  Frequency 7X/week   Progress towards PT Goals Progress towards PT goals: Progressing toward goals  Plan Current plan remains appropriate    Precautions / Restrictions Precautions Precautions: Posterior Hip Precaution Comments: Reviewed posterior hip precautions Restrictions Weight Bearing Restrictions: Yes LLE Weight Bearing: Weight bearing as tolerated   Pertinent Vitals/Pain 8/10 post amb; patient repositioned for comfort RN provided medication to assist with pain control     Mobility  Bed Mobility Overal bed mobility: Needs Assistance Bed Mobility: Supine to Sit;Sit to Supine Supine to sit: Min assist Sit to supine: Min assist General bed mobility comments: Min assist for helping LLE into bed; cues for technique; bed elevated to approximate home Transfers Overall transfer level: Needs assistance Equipment used: Rolling walker (2 wheeled) Transfers: Sit to/from Stand Sit to Stand: Min guard General transfer comment: Able to safely perform sit/stand from lowest bed setting while adhearing to hip precautions with Min guard for safety Ambulation/Gait Ambulation/Gait assistance: Supervision Ambulation Distance (Feet): 65 Feet Assistive device: Rolling walker (2 wheeled) Gait Pattern/deviations: Step-to pattern Gait velocity:  slowed General Gait Details: Pt amb without physical assistance. Verbal cues for turning while adhearing to hip precautions    Exercises Total Joint Exercises Ankle Circles/Pumps: AROM;Both;10 reps;Supine Quad Sets: AROM;Left;5 reps;Supine Heel Slides: AAROM;Left;5 reps;Supine   PT Diagnosis:    PT Problem List:   PT Treatment Interventions:     PT Goals (current goals can now be found in the care plan section) Acute Rehab PT Goals PT Goal Formulation: With patient Time For Goal Achievement: 05/28/13 Potential to Achieve Goals: Good  Visit Information  Last PT Received On: 05/21/13 Assistance Needed: +1 History of Present Illness: LEFT TOTAL HIP ARTHROPLASTY (Left    Subjective Data  Subjective: Pt reports she has just recently ambulated to the restroom with staff, but would not mind amb with physical therapy.   Cognition  Cognition Arousal/Alertness: Awake/alert Behavior During Therapy: WFL for tasks assessed/performed Overall Cognitive Status: Within Functional Limits for tasks assessed    Balance     End of Session PT - End of Session Activity Tolerance: Patient tolerated treatment well Patient left: in bed;with call bell/phone within reach;with nursing/sitter in room Nurse Communication: Patient requests pain meds   GP     [place], [place], [place] With Candie Mile 05/21/2013, 4:42 PM

## 2013-05-21 NOTE — Progress Notes (Signed)
PATIENT ID: Laura Zavala  MRN: [id number]  DOB/AGE:  [birth year] / 68 y.o.  1 Day Post-Op Procedure(s) (LRB): LEFT TOTAL HIP ARTHROPLASTY (Left)    PROGRESS NOTE Subjective: Patient is alert, oriented,no Nausea, no Vomiting, yes passing gas, no Bowel Movement. Taking PO well. Denies SOB, Chest or Calf Pain. Using Incentive Spirometer, PAS in place. Ambulate WBAT Patient reports pain as 4 on 0-10 scale  .    Objective: Vital signs in last 24 hours: Filed Vitals:   05/20/13 1630 05/20/13 1700 05/20/13 2041 05/21/13 0557  BP: 132/72 135/82 132/84 135/85  Pulse: 97 100 95 92  Temp: 97.8 F (36.6 C) 97.8 F (36.6 C) 97.6 F (36.4 C) 97.8 F (36.6 C)  TempSrc:      Resp: 16 18 18 18   SpO2: 99% 97% 99% 99%      Intake/Output from previous day: I/O last 3 completed shifts: In: 2900 [P.O.:600; I.V.:2300] Out: 300 [Urine:150; Blood:150]   Intake/Output this shift:     LABORATORY DATA:  Recent Labs  05/21/13 0427  WBC 10.3  HGB 11.3*  HCT 32.4*  PLT 186  NA 140  K 4.5  CL 103  CO2 27  BUN 10  CREATININE 0.77  GLUCOSE 133*  CALCIUM 8.6    Examination: Neurologically intact ABD soft Neurovascular intact Sensation intact distally Intact pulses distally Dorsiflexion/Plantar flexion intact Incision: scant drainage No cellulitis present Compartment soft} XR AP&Lat of hip shows well placed\fixed THA  Assessment:   1 Day Post-Op Procedure(s) (LRB): LEFT TOTAL HIP ARTHROPLASTY (Left) ADDITIONAL DIAGNOSIS:  Hypertension  Plan: PT/OT WBAT, THA  posterior precautions,Benadryl for itching, we will use 2 mg Dilaudid tablets for severe pain at home as the patient gets a rash with oxycodone.  DVT Prophylaxis: SCDx72 hrs, ASA 325 mg BID x 2 weeks  DISCHARGE PLAN: Home  DISCHARGE NEEDS: HHPT, HHRN, CPM, Walker and 3-in-1 comode seat

## 2013-05-21 NOTE — Progress Notes (Signed)
OT Cancellation Note  Patient Details Name: Laura Zavala MRN: [id number] DOB: 1945-05-19   Cancelled Treatment:    Reason Eval/Treat Not Completed: OT screened, no needs identified, will sign off OT spoke with pt and pt verbalizes she has no OT needs.   Benito Mccreedy OTR/L [phone] 05/21/2013, 3:40 PM

## 2013-05-22 LAB — CBC
HEMATOCRIT: 31.7 % — AB (ref 36.0–46.0)
Hemoglobin: 10.9 g/dL — ABNORMAL LOW (ref 12.0–15.0)
MCH: 31.8 pg (ref 26.0–34.0)
MCHC: 34.4 g/dL (ref 30.0–36.0)
MCV: 92.4 fL (ref 78.0–100.0)
Platelets: 158 10*3/uL (ref 150–400)
RBC: 3.43 MIL/uL — AB (ref 3.87–5.11)
RDW: 13.4 % (ref 11.5–15.5)
WBC: 9.4 10*3/uL (ref 4.0–10.5)

## 2013-05-22 MED ORDER — ASPIRIN EC 325 MG PO TBEC: 325.0000 mg | DELAYED_RELEASE_TABLET | Freq: Two times a day (BID) | ORAL | Status: AC

## 2013-05-22 MED ORDER — METHOCARBAMOL 500 MG PO TABS: 500.0000 mg | ORAL_TABLET | Freq: Two times a day (BID) | ORAL | Status: AC

## 2013-05-22 NOTE — Care Management Note (Signed)
CARE MANAGEMENT NOTE 05/22/2013  Patient:  VELIA, PAMER   Account Number:  [id number]  Date Initiated:  05/22/2013  Documentation initiated by:  Ricki Miller  Subjective/Objective Assessment:   68 yr old female s/p Left total hip arthroplasty.     Action/Plan:   Case Manager spoke with patient concerning home health and DME needs at Discharge. Choice offered. Patient states she is returning to [facility].  CM contacted [facility] liasion who will arrange for [facility] PT. DME has been delivered.   Anticipated DC Date:  05/22/2013   Anticipated DC Plan:  [place]  CM consult      PAC Choice  [place]   Choice offered to / List presented to:  C-1 Patient   DME arranged  3-N-1  [place]  CPM      DME agency  [employer]     [place] arranged  HH-2 PT      [place].   Status of service:  Completed, signed off Medicare Important Message given?   (If response is "NO", the following Medicare IM given date fields will be blank) Date Medicare IM given:   Date Additional Medicare IM given:    Discharge Disposition:  [place]  Per UR Regulation:    If discussed at Long Length of Stay Meetings, dates discussed:    Comments:  05/22/13 10:00am Ricki Miller, RN BSN Case Manager Patient's address verified: [address], [place], [place] [address]   Phone:[phone]

## 2013-05-22 NOTE — Discharge Summary (Signed)
Patient ID: Laura Zavala MRN: [id number] DOB/AGE: 1945/11/27 68 y.o.  Admit date: 05/20/2013 Discharge date: 05/22/2013  Admission Diagnoses:  Principal Problem:   Osteoarthritis of left hip Active Problems:   Arthritis of left hip   Discharge Diagnoses:  Same  Past Medical History  Diagnosis Date  . Complication of anesthesia     Hx: of SOB : when I was under the age of 75."  . Hearing impaired     Hx: of B/L hearing aids  . CHF (congestive heart failure)   . Hypercholesterolemia     Hx: of  . Hypothyroidism   . Mental disorder   . Anxiety   . Depression   . Tremor, hereditary, benign     Hx: of head  . Hypertension     "put on RX in 2004"  . Pneumonia     "once" (05/20/2013)  . OA (osteoarthritis)   . Arthritis     "hips" (05/20/2013)  . Basal cell carcinoma     "nose" (05/20/2013)  . Breast cancer     "right" (05/20/2013)    Surgeries: Procedure(s): LEFT TOTAL HIP ARTHROPLASTY on 05/20/2013   Consultants:    Discharged Condition: Improved  Hospital Course: Laura Zavala is an 68 y.o. female who was admitted 05/20/2013 for operative treatment ofOsteoarthritis of left hip. Patient has severe unremitting pain that affects sleep, daily activities, and work/hobbies. After pre-op clearance the patient was taken to the operating room on 05/20/2013 and underwent  Procedure(s): LEFT TOTAL HIP ARTHROPLASTY.    Patient was given perioperative antibiotics: Anti-infectives   Start     Dose/Rate Route Frequency Ordered Stop   05/20/13 0600  ceFAZolin (ANCEF) IVPB 2 g/50 mL premix     2 g 100 mL/hr over 30 Minutes Intravenous On call to O.R. 05/19/13 1319 05/20/13 6433       Patient was given sequential compression devices, early ambulation, and chemoprophylaxis to prevent DVT.  Patient benefited maximally from hospital stay and there were no complications.    Recent vital signs: Patient Vitals for the past 24 hrs:  BP Temp Pulse Resp SpO2  05/22/13 0400 - - - 18  99 %  05/22/13 0000 - - - 18 100 %  05/21/13 2139 112/75 mmHg 98.7 F (37.1 C) 95 18 99 %  05/21/13 2000 - - - 16 97 %  05/21/13 1500 111/70 mmHg 100.1 F (37.8 C) 101 - 97 %  05/21/13 1400 111/70 mmHg 100.1 F (37.8 C) 101 16 97 %  05/21/13 0800 - - - 18 94 %     Recent laboratory studies:  Recent Labs  05/21/13 0427 05/22/13 0501  WBC 10.3 9.4  HGB 11.3* 10.9*  HCT 32.4* 31.7*  PLT 186 158  NA 140  --   K 4.5  --   CL 103  --   CO2 27  --   BUN 10  --   CREATININE 0.77  --   GLUCOSE 133*  --   CALCIUM 8.6  --      Discharge Medications:     Medication List    STOP taking these medications       meloxicam 15 MG tablet  Commonly known as:  MOBIC      TAKE these medications       aspirin EC 325 MG tablet  Take 1 tablet (325 mg total) by mouth 2 (two) times daily.     carvedilol 25 MG tablet  Commonly known as:  COREG  Take 25 mg by mouth 2 (two) times daily with a meal.     DULoxetine 30 MG capsule  Commonly known as:  CYMBALTA  Take 30 mg by mouth daily.     HYDROmorphone 2 MG tablet  Commonly known as:  DILAUDID  Take 1 tablet (2 mg total) by mouth every 4 (four) hours as needed for severe pain.     levothyroxine 50 MCG tablet  Commonly known as:  SYNTHROID, LEVOTHROID  Take 50 mcg by mouth daily before breakfast.     methocarbamol 500 MG tablet  Commonly known as:  ROBAXIN  Take 1 tablet (500 mg total) by mouth 2 (two) times daily with a meal.     rosuvastatin 5 MG tablet  Commonly known as:  CRESTOR  Take 5 mg by mouth at bedtime.        Diagnostic Studies: Dg Chest 2 View  05/16/2013   CLINICAL DATA:  Preoperative evaluation for hip surgery  EXAM: CHEST  2 VIEW  COMPARISON:  07/11/2012  FINDINGS: The cardiac shadow is within normal limits. The lungs are well aerated bilaterally. Some chronic scarring is again noted in the right upper lobe. No focal infiltrate or effusion is seen. No bony abnormality is noted.  IMPRESSION: No active  cardiopulmonary disease.   Electronically Signed   By: Inez Catalina M.D.   On: 05/16/2013 11:20   Dg Pelvis Portable  05/20/2013   CLINICAL DATA:  Hip surgery.  EXAM: PORTABLE PELVIS 1-2 VIEWS  COMPARISON:  DG PELVIS PORTABLE dated 10/02/2009  FINDINGS: Patient status post total left hip replacement. Good anatomic alignment noted. Postsurgical soft tissue changes on the left. Right total hip replacement noted. Degenerative changes lumbar spine.  IMPRESSION: Patient status post total left hip replacement with good anatomic alignment on AP portable views.   Electronically Signed   By: Marcello Moores  Register   On: 05/20/2013 13:49    Disposition:       Discharge Orders   Future Orders Complete By Expires   Call MD / Call 911  As directed    Comments:     If you experience chest pain or shortness of breath, CALL 911 and be transported to the hospital emergency room.  If you develope a fever above 101 F, pus (white drainage) or increased drainage or redness at the wound, or calf pain, call your surgeon's office.   Change dressing  As directed    Comments:     You may change your dressing on day 5, then change the dressing daily with sterile 4 x 4 inch gauze dressing and paper tape.  You may clean the incision with alcohol prior to redressing   Constipation Prevention  As directed    Comments:     Drink plenty of fluids.  Prune juice may be helpful.  You may use a stool softener, such as Colace (over the counter) 100 mg twice a day.  Use MiraLax (over the counter) for constipation as needed.   Diet - low sodium heart healthy  As directed    Discharge instructions  As directed    Comments:     Follow up in office with Dr. Mayer Camel in 2 weeks.   Driving restrictions  As directed    Comments:     No driving for 2 weeks   Follow the hip precautions as taught in Physical Therapy  As directed    Increase activity slowly as tolerated  As directed    Patient may shower  As directed    Comments:     You may  shower without a dressing once there is no drainage.  Do not wash over the wound.  If drainage remains, cover wound with plastic wrap and then shower.      Follow-up Information   Follow up with Kerin Salen, MD In 2 weeks.   Specialty:  Orthopedic Surgery   Contact information:   [place] [address] [phone]        Signed: Theodosia Quay 05/22/2013, 7:49 AM

## 2013-05-22 NOTE — Progress Notes (Signed)
PATIENT ID: Laura Zavala  MRN: [id number]  DOB/AGE:  [birth year] / 68 y.o.  2 Days Post-Op Procedure(s) (LRB): LEFT TOTAL HIP ARTHROPLASTY (Left)    PROGRESS NOTE Subjective: Patient is alert, oriented,no Nausea, no Vomiting, yes passing gas, no Bowel Movement. Taking PO well. Denies SOB, Chest or Calf Pain. Using Incentive Spirometer, PAS in place. Ambulate WBAT Patient reports pain as 5 on 0-10 scale  .    Objective: Vital signs in last 24 hours: Filed Vitals:   05/21/13 2000 05/21/13 2139 05/22/13 0000 05/22/13 0400  BP:  112/75    Pulse:  95    Temp:  98.7 F (37.1 C)    TempSrc:      Resp: 16 18 18 18   SpO2: 97% 99% 100% 99%      Intake/Output from previous day: I/O last 3 completed shifts: In: 2120 [P.O.:1320; I.V.:800] Out: 120 [Urine:120]   Intake/Output this shift:     LABORATORY DATA:  Recent Labs  05/21/13 0427 05/22/13 0501  WBC 10.3 9.4  HGB 11.3* 10.9*  HCT 32.4* 31.7*  PLT 186 158  NA 140  --   K 4.5  --   CL 103  --   CO2 27  --   BUN 10  --   CREATININE 0.77  --   GLUCOSE 133*  --   CALCIUM 8.6  --     Examination: Neurologically intact ABD soft Neurovascular intact Sensation intact distally Intact pulses distally Dorsiflexion/Plantar flexion intact Incision: scant drainage No cellulitis present Compartment soft} XR AP&Lat of hip shows well placed\fixed THA  Assessment:   2 Days Post-Op Procedure(s) (LRB): LEFT TOTAL HIP ARTHROPLASTY (Left) ADDITIONAL DIAGNOSIS:  Hypertension  Plan: PT/OT WBAT, THA  posterior precautions  DVT Prophylaxis: SCDx72 hrs, ASA 325 mg BID x 2 weeks  DISCHARGE PLAN: Home today  DISCHARGE NEEDS: HHPT, HHRN, Walker and 3-in-1 comode seat

## 2013-05-22 NOTE — Progress Notes (Signed)
Physical Therapy Treatment Patient Details Name: Laura Zavala MRN: [id number] DOB: 1945/11/08 Today's Date: 05/22/2013 Time: 6378-5885 PT Time Calculation (min): 28 min  PT Assessment / Plan / Recommendation  History of Present Illness LEFT TOTAL HIP ARTHROPLASTY (Left   PT Comments   Good progress with mobility, with pain much less today, leading to more efficient movement Ok for dc home from PT standpoint  Follow Up Recommendations  Home health PT;Supervision/Assistance - 24 hour     Does the patient have the potential to tolerate intense rehabilitation     Barriers to Discharge        Equipment Recommendations  None recommended by PT    Recommendations for Other Services    Frequency 7X/week   Progress towards PT Goals Progress towards PT goals: Progressing toward goals  Plan Current plan remains appropriate    Precautions / Restrictions Precautions Precautions: Posterior Hip Precaution Comments: Reviewed posterior hip precautions Restrictions LLE Weight Bearing: Weight bearing as tolerated   Pertinent Vitals/Pain 5/10 L hip patient repositioned for comfort     Mobility  Bed Mobility Overal bed mobility: Needs Assistance Bed Mobility: Supine to Sit Supine to sit: Min assist General bed mobility comments: Cues for technqiue; min assist for LLE and close guard to avoid internal rotation Transfers Overall transfer level: Needs assistance Equipment used: Rolling walker (2 wheeled) Transfers: Sit to/from Stand Sit to Stand: Supervision General transfer comment: Able to safely perform sit/stand from lowest bed setting while adhearing to hip precautions with Min guard for safety Ambulation/Gait Ambulation/Gait assistance: Supervision Ambulation Distance (Feet): 80 Feet Assistive device: Rolling walker (2 wheeled) Gait Pattern/deviations: Step-through pattern General Gait Details: Pt amb without physical assistance. Verbal cues for turning while adhearing to hip  precautions Stairs:  (reports is confident that she can manage her 1STE)    Exercises Total Joint Exercises Quad Sets: AROM;Left;10 reps Gluteal Sets: AROM;Both;10 reps Towel Squeeze: AROM;Both;10 reps Heel Slides: AROM;Left;10 reps Hip ABduction/ADduction: AROM;Left;10 reps   PT Diagnosis:    PT Problem List:   PT Treatment Interventions:     PT Goals (current goals can now be found in the care plan section) Acute Rehab PT Goals Patient Stated Goal: back to dancing PT Goal Formulation: With patient Time For Goal Achievement: 05/28/13 Potential to Achieve Goals: Good  Visit Information  Last PT Received On: 05/22/13 Assistance Needed: +1 History of Present Illness: LEFT TOTAL HIP ARTHROPLASTY (Left    Subjective Data  Subjective: Looking forward to getting home Patient Stated Goal: back to dancing   Cognition  Cognition Arousal/Alertness: Awake/alert Behavior During Therapy: WFL for tasks assessed/performed Overall Cognitive Status: Within Functional Limits for tasks assessed    Balance     End of Session PT - End of Session Activity Tolerance: Patient tolerated treatment well Patient left: in chair;with call bell/phone within reach Nurse Communication: Mobility status   GP     [place] 05/22/2013, 1:16 PM [place], [place]

## 2015-07-30 IMAGING — CR DG CHEST 2V
2 series · 2 of 2 positions shown · non-contrast
Comparison: 07/11/2012

CLINICAL DATA: Preoperative evaluation for hip surgery

EXAM:
CHEST  2 VIEW

[w chest pa]
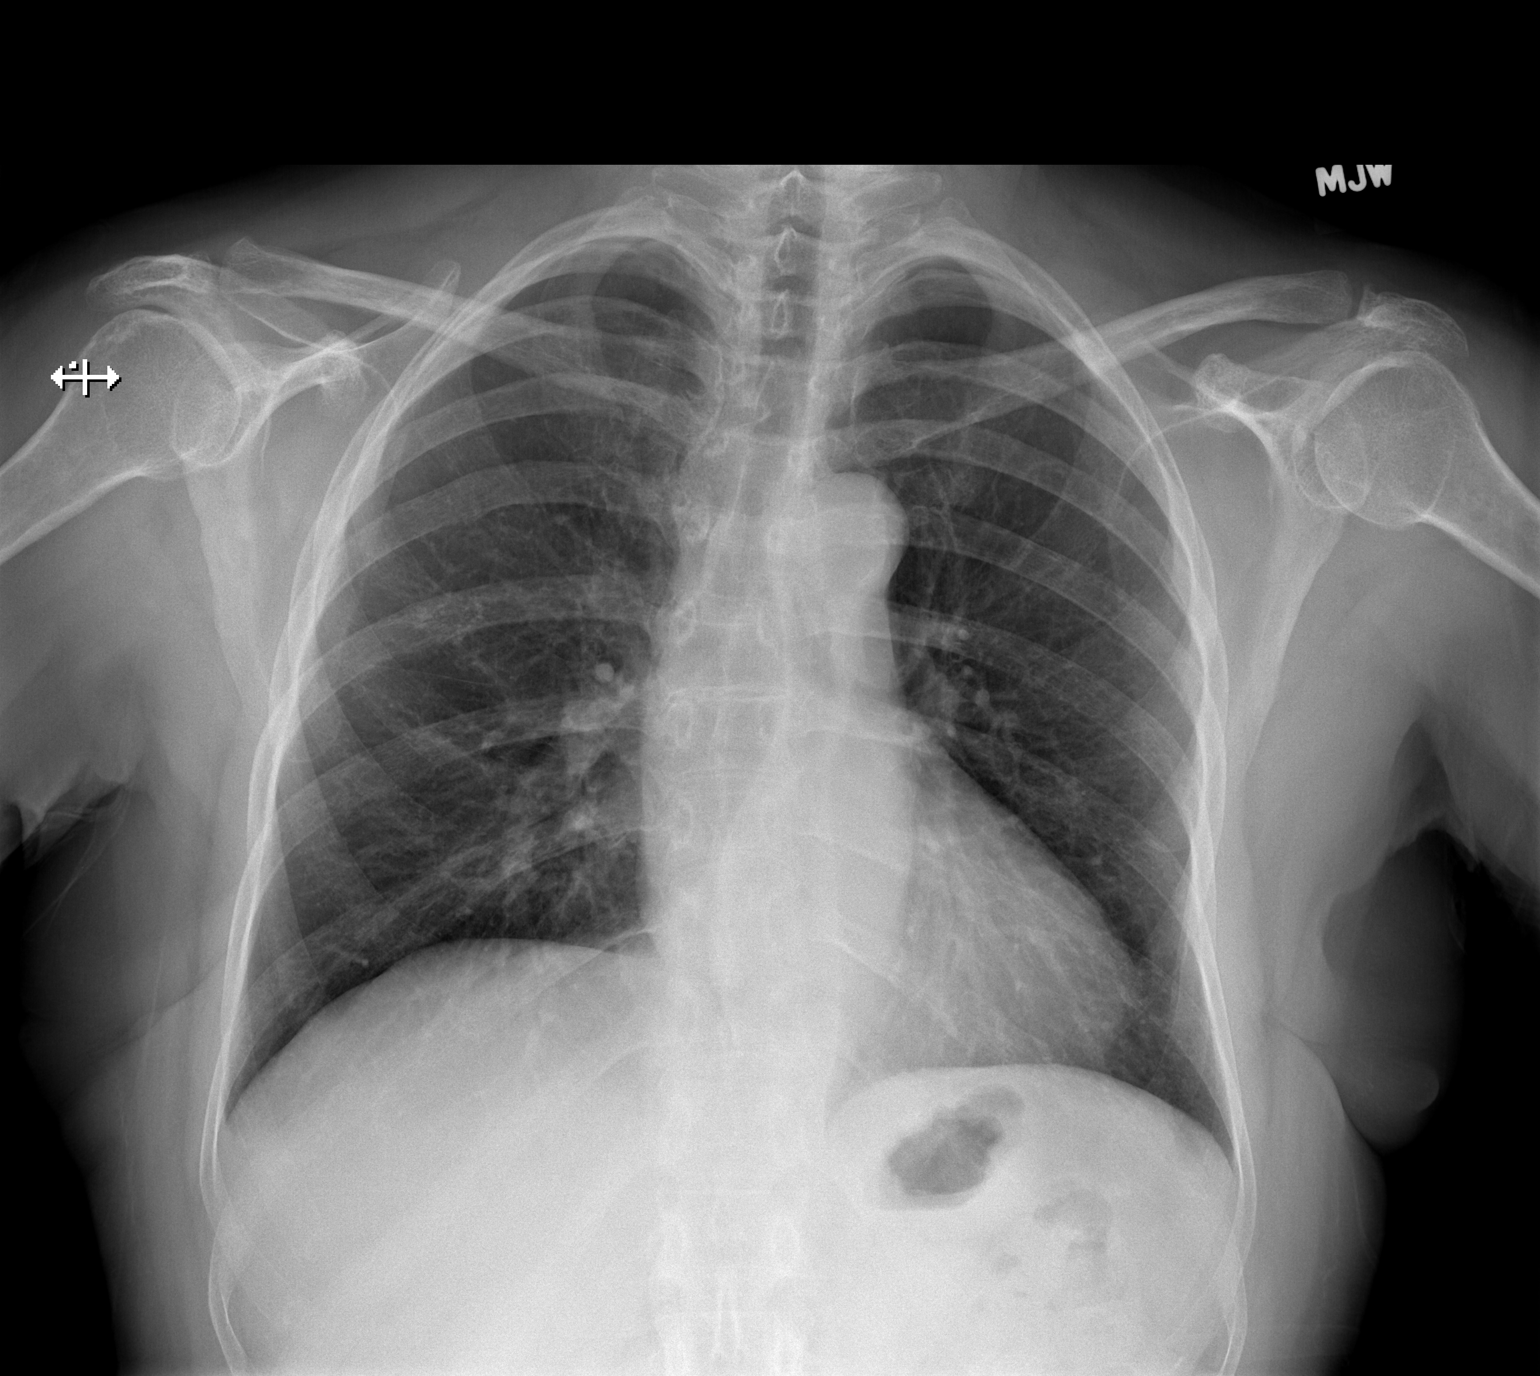

[w chest lat]
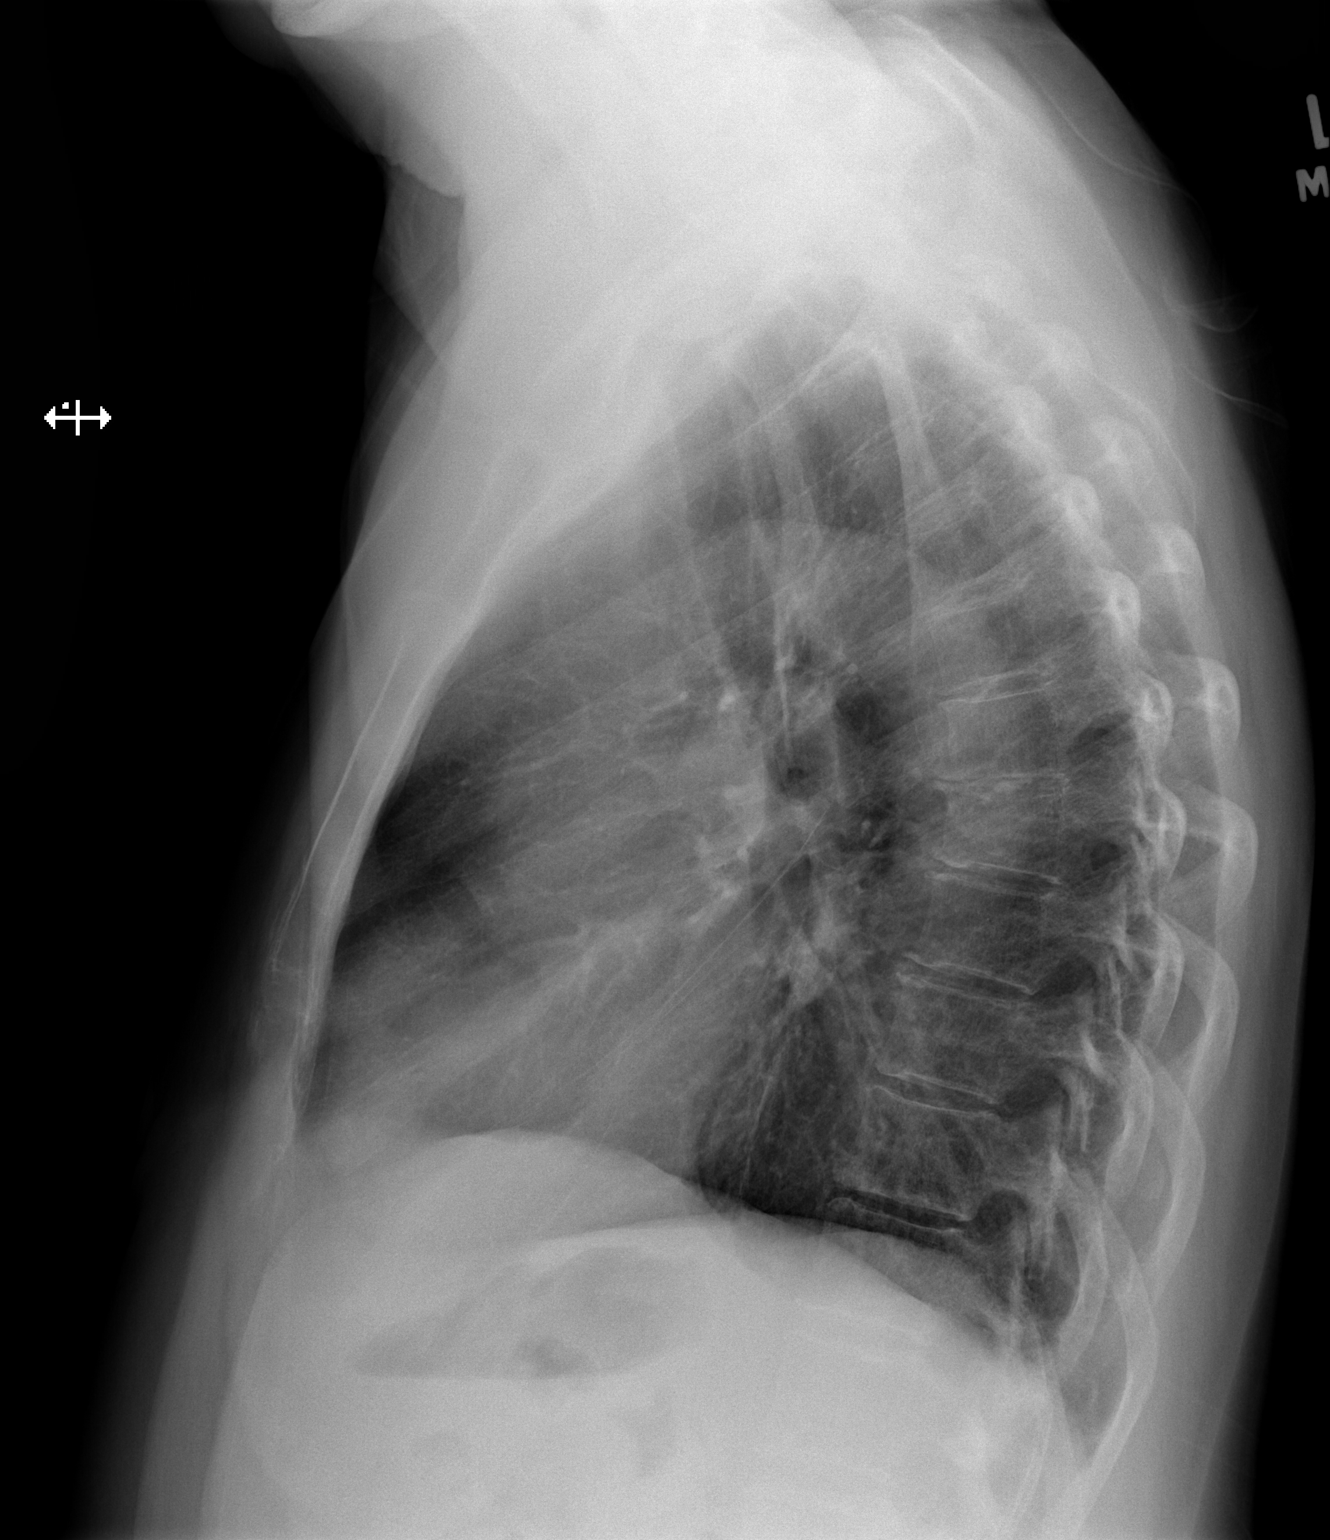

[2 of 2 positions shown; findings below may reference images not displayed]

FINDINGS: The cardiac shadow is within normal limits. The lungs are well
aerated bilaterally. Some chronic scarring is again noted in the
right upper lobe. No focal infiltrate or effusion is seen. No bony
abnormality is noted.
IMPRESSION: No active cardiopulmonary disease.

## 2015-08-03 IMAGING — CR DG PORTABLE PELVIS
2 series · 2 of 2 positions shown · non-contrast
Comparison: DG PELVIS PORTABLE dated 10/02/2009

CLINICAL DATA: Hip surgery.

EXAM:
PORTABLE PELVIS 1-2 VIEWS

[AP (1 of 2)]
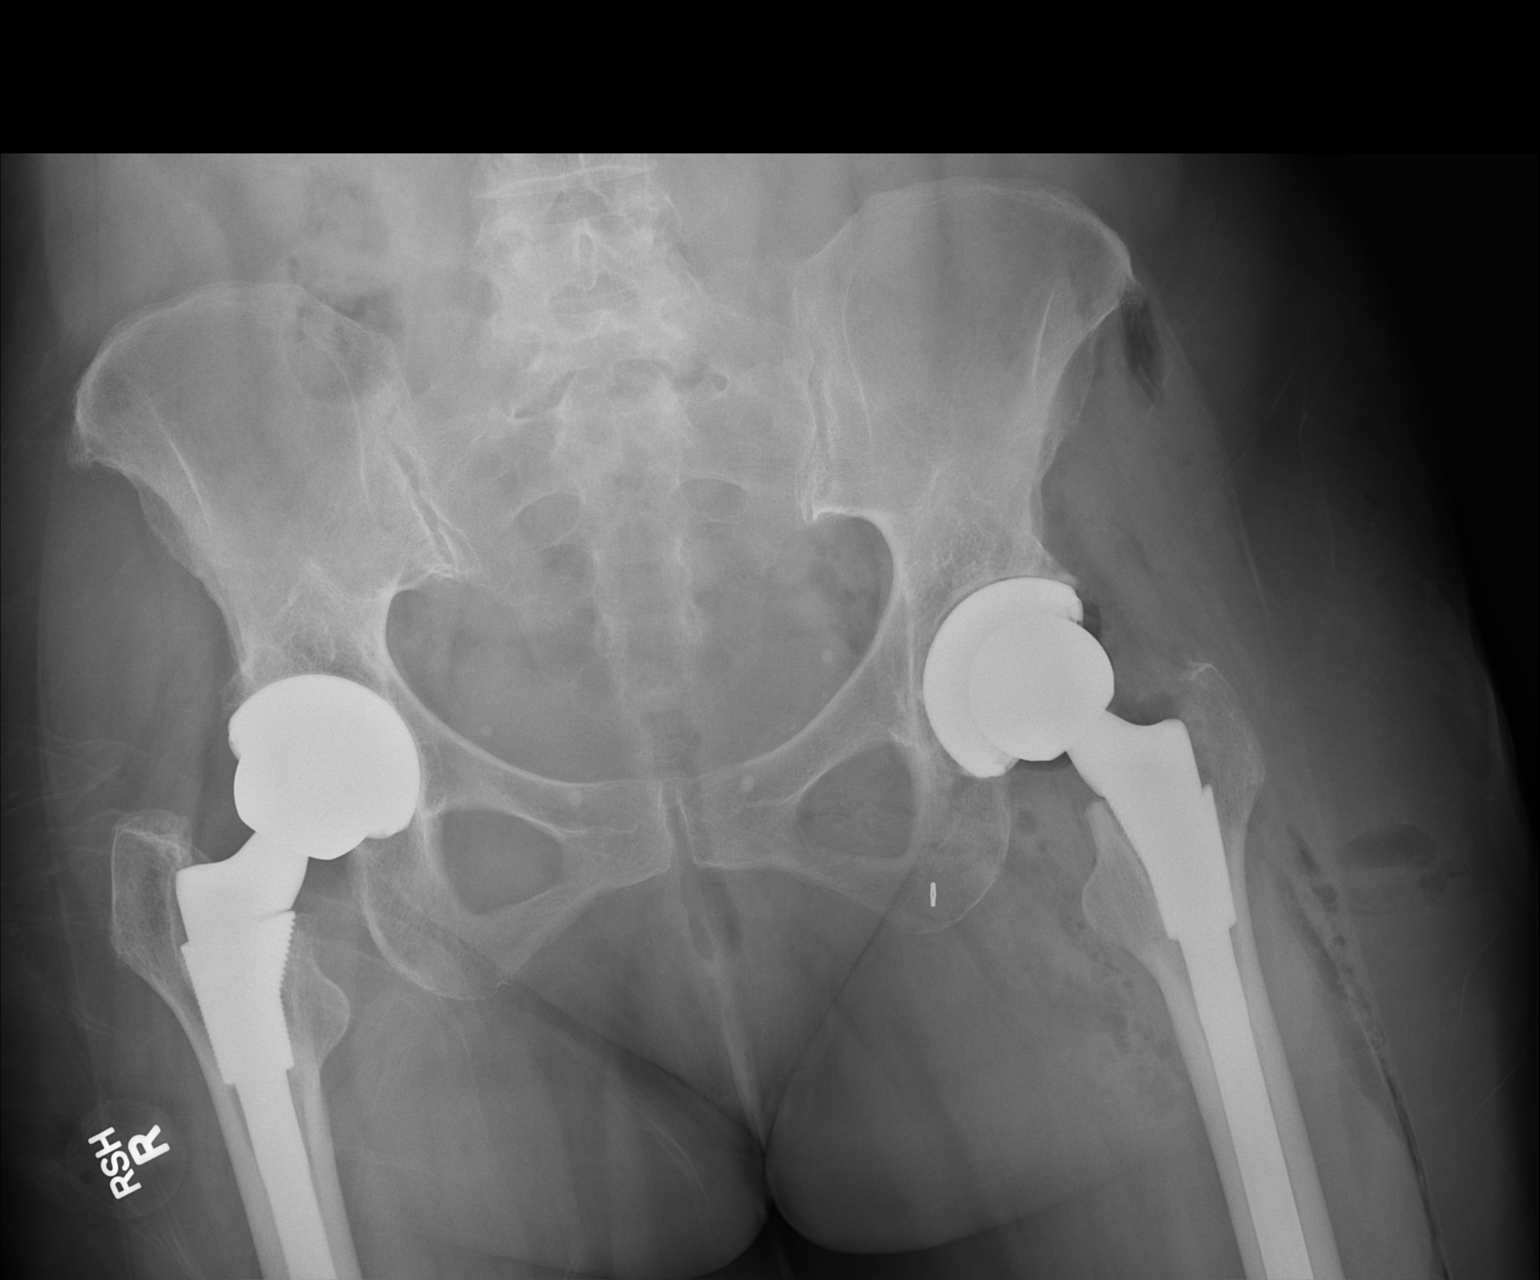

[AP (2 of 2)]
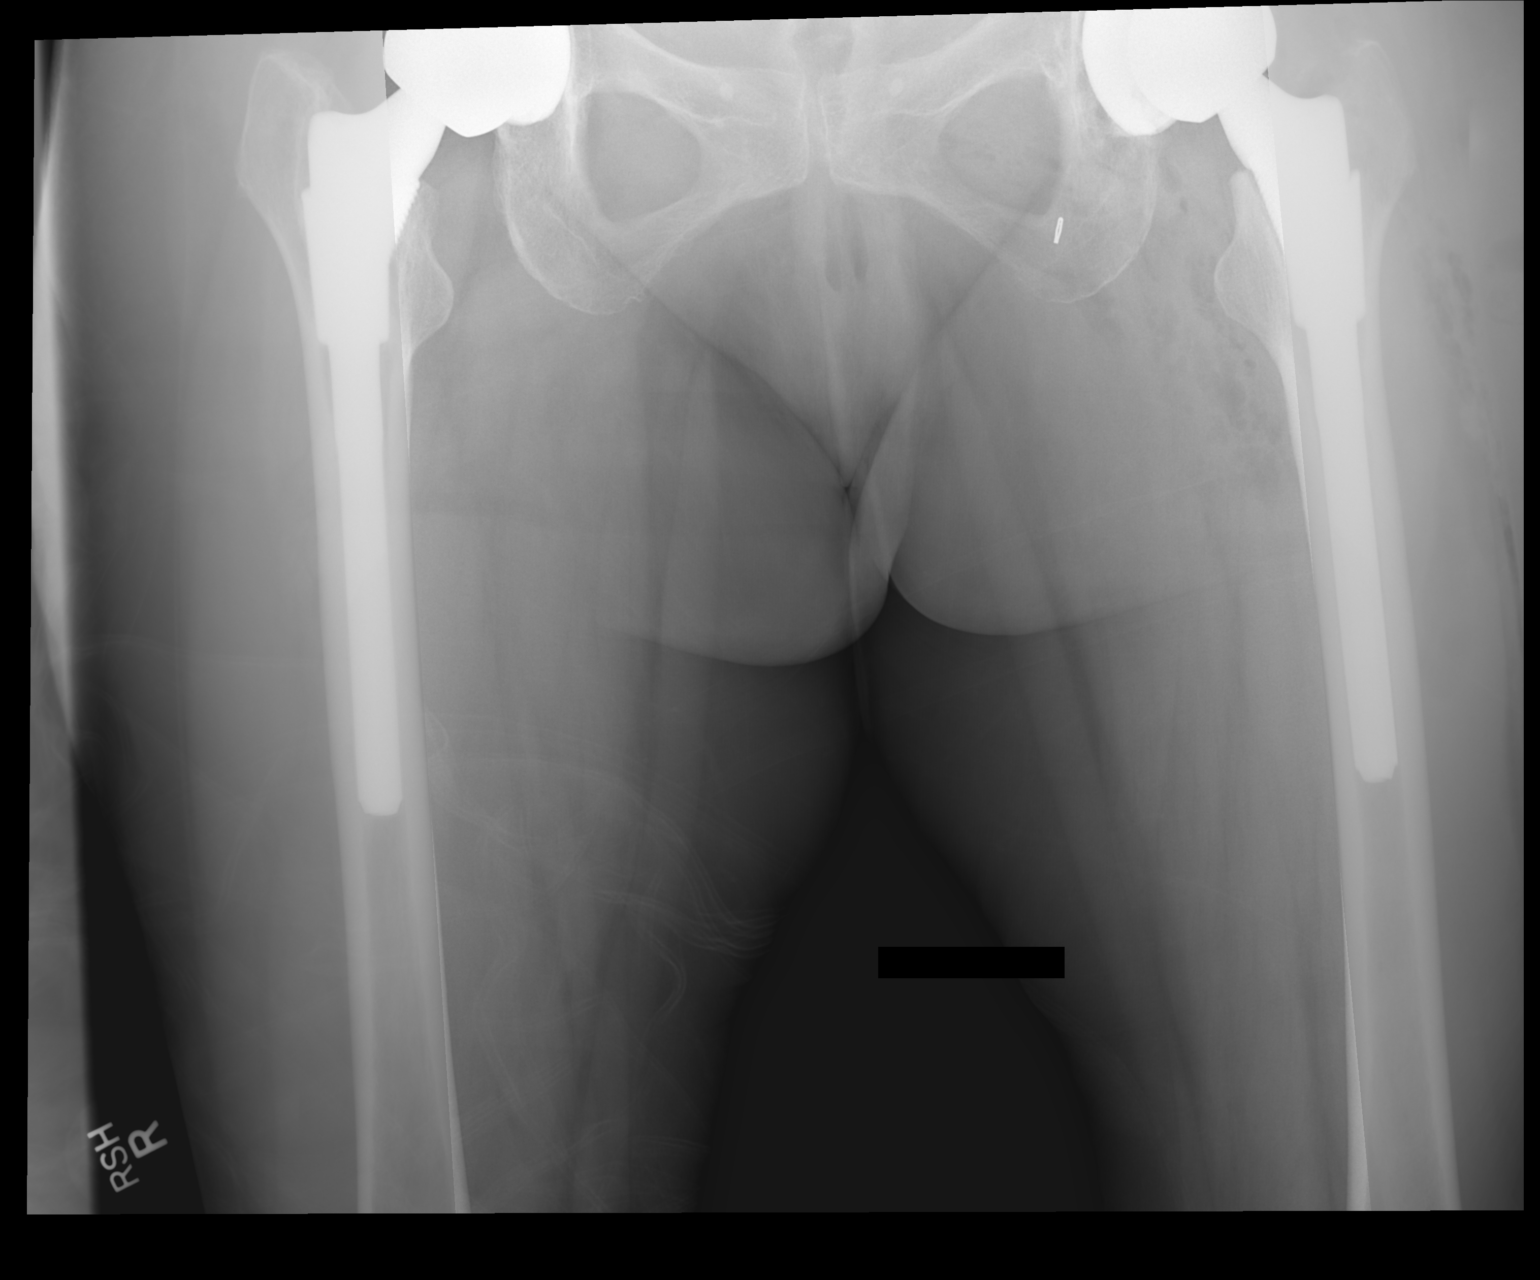

[2 of 2 positions shown; findings below may reference images not displayed]

FINDINGS: Patient status post total left hip replacement. Good anatomic
alignment noted. Postsurgical soft tissue changes on the left. Right
total hip replacement noted. Degenerative changes lumbar spine.
IMPRESSION: Patient status post total left hip replacement with good anatomic
alignment on AP portable views.
